# Patient Record
Sex: Female | Born: 1985 | Race: Asian | Hispanic: No | Smoking: Never smoker
Health system: Southern US, Community
[De-identification: ages and names within clinical notes are randomized; demographics above are authoritative.]

## PROBLEM LIST (undated history)

## (undated) DIAGNOSIS — Z789 Other specified health status: Secondary | ICD-10-CM

## (undated) HISTORY — PX: NO PAST SURGERIES: SHX2092

---

## 2009-08-11 ENCOUNTER — Emergency Department (HOSPITAL_COMMUNITY): Admission: EM | Admit: 2009-08-11 | Discharge: 2009-08-11 | Payer: Self-pay | Admitting: Family Medicine

## 2009-08-15 ENCOUNTER — Encounter: Admission: RE | Admit: 2009-08-15 | Discharge: 2009-08-15 | Payer: Self-pay | Admitting: Family Medicine

## 2010-12-30 LAB — POCT PREGNANCY, URINE: Preg Test, Ur: NEGATIVE

## 2012-10-18 ENCOUNTER — Encounter (HOSPITAL_COMMUNITY): Payer: Self-pay

## 2012-10-18 ENCOUNTER — Inpatient Hospital Stay (HOSPITAL_COMMUNITY)
Admission: AD | Admit: 2012-10-18 | Discharge: 2012-10-22 | DRG: 371 | Disposition: A | Discharge status: Home / Self Care | Payer: BC Managed Care – PPO | Source: Ambulatory Visit | Attending: Obstetrics and Gynecology | Admitting: Obstetrics and Gynecology

## 2012-10-18 HISTORY — DX: Other specified health status: Z78.9

## 2012-10-18 LAB — CBC WITH DIFFERENTIAL/PLATELET
Basophils Relative: 0 % (ref 0–1)
Eosinophils Absolute: 0 10*3/uL (ref 0.0–0.7)
Hemoglobin: 14 g/dL (ref 12.0–15.0)
MCH: 27.8 pg (ref 26.0–34.0)
MCHC: 33.5 g/dL (ref 30.0–36.0)
Monocytes Relative: 7 % (ref 3–12)
Neutrophils Relative %: 76 % (ref 43–77)
Platelets: 160 10*3/uL (ref 150–400)
RDW: 14.3 % (ref 11.5–15.5)

## 2012-10-18 LAB — OB RESULTS CONSOLE GC/CHLAMYDIA: Chlamydia: NEGATIVE

## 2012-10-18 LAB — OB RESULTS CONSOLE RUBELLA ANTIBODY, IGM: Rubella: IMMUNE

## 2012-10-18 LAB — LACTATE DEHYDROGENASE: LDH: 282 U/L — ABNORMAL HIGH (ref 94–250)

## 2012-10-18 LAB — TYPE AND SCREEN

## 2012-10-18 LAB — COMPREHENSIVE METABOLIC PANEL
Albumin: 2.7 g/dL — ABNORMAL LOW (ref 3.5–5.2)
Alkaline Phosphatase: 77 U/L (ref 39–117)
BUN: 10 mg/dL (ref 6–23)
Potassium: 4.1 mEq/L (ref 3.5–5.1)
Total Protein: 6.5 g/dL (ref 6.0–8.3)

## 2012-10-18 LAB — AMNISURE RUPTURE OF MEMBRANE (ROM) NOT AT ARMC: Amnisure ROM: POSITIVE

## 2012-10-18 LAB — OB RESULTS CONSOLE RPR: RPR: NONREACTIVE

## 2012-10-18 LAB — URIC ACID: Uric Acid, Serum: 6.4 mg/dL (ref 2.4–7.0)

## 2012-10-18 LAB — OB RESULTS CONSOLE HEPATITIS B SURFACE ANTIGEN: Hepatitis B Surface Ag: NEGATIVE

## 2012-10-18 MED ORDER — TERBUTALINE SULFATE 1 MG/ML IJ SOLN
0.2500 mg | Freq: Once | INTRAMUSCULAR | Status: AC | PRN
  Administered 2012-10-19: 0.25 mg via SUBCUTANEOUS
  Filled 2012-10-18: qty 1

## 2012-10-18 MED ORDER — EPHEDRINE 5 MG/ML INJ: 10.0000 mg | INTRAVENOUS | Status: DC | PRN

## 2012-10-18 MED ORDER — CITRIC ACID-SODIUM CITRATE 334-500 MG/5ML PO SOLN
30.0000 mL | ORAL | Status: DC | PRN
  Administered 2012-10-19: 30 mL via ORAL
  Filled 2012-10-18: qty 15

## 2012-10-18 MED ORDER — OXYTOCIN BOLUS FROM INFUSION: 500.0000 mL | INTRAVENOUS | Status: DC

## 2012-10-18 MED ORDER — LACTATED RINGERS IV SOLN
INTRAVENOUS | Status: DC
  Administered 2012-10-18 – 2012-10-19 (×2): via INTRAVENOUS

## 2012-10-18 MED ORDER — IBUPROFEN 600 MG PO TABS: 600.0000 mg | ORAL_TABLET | Freq: Four times a day (QID) | ORAL | Status: DC | PRN

## 2012-10-18 MED ORDER — ONDANSETRON HCL 4 MG/2ML IJ SOLN: 4.0000 mg | Freq: Four times a day (QID) | INTRAMUSCULAR | Status: DC | PRN

## 2012-10-18 MED ORDER — FENTANYL 2.5 MCG/ML BUPIVACAINE 1/10 % EPIDURAL INFUSION (WH - ANES)
14.0000 mL/h | INTRAMUSCULAR | Status: DC
  Administered 2012-10-19: 14 mL/h via EPIDURAL
  Filled 2012-10-18: qty 125

## 2012-10-18 MED ORDER — ZOLPIDEM TARTRATE 5 MG PO TABS: 5.0000 mg | ORAL_TABLET | Freq: Every evening | ORAL | Status: DC | PRN

## 2012-10-18 MED ORDER — LACTATED RINGERS IV SOLN: 500.0000 mL | INTRAVENOUS | Status: DC | PRN

## 2012-10-18 MED ORDER — EPHEDRINE 5 MG/ML INJ
10.0000 mg | INTRAVENOUS | Status: DC | PRN
  Filled 2012-10-18: qty 4

## 2012-10-18 MED ORDER — DIPHENHYDRAMINE HCL 50 MG/ML IJ SOLN: 12.5000 mg | INTRAMUSCULAR | Status: DC | PRN

## 2012-10-18 MED ORDER — LABETALOL HCL 5 MG/ML IV SOLN
10.0000 mg | INTRAVENOUS | Status: DC | PRN
  Filled 2012-10-18: qty 4

## 2012-10-18 MED ORDER — PHENYLEPHRINE 40 MCG/ML (10ML) SYRINGE FOR IV PUSH (FOR BLOOD PRESSURE SUPPORT)
80.0000 ug | PREFILLED_SYRINGE | INTRAVENOUS | Status: DC | PRN
  Filled 2012-10-18: qty 5

## 2012-10-18 MED ORDER — LIDOCAINE HCL (PF) 1 % IJ SOLN
30.0000 mL | INTRAMUSCULAR | Status: DC | PRN
  Filled 2012-10-18: qty 30

## 2012-10-18 MED ORDER — ACETAMINOPHEN 325 MG PO TABS: 650.0000 mg | ORAL_TABLET | ORAL | Status: DC | PRN

## 2012-10-18 MED ORDER — OXYTOCIN 40 UNITS IN LACTATED RINGERS INFUSION - SIMPLE MED
1.0000 m[IU]/min | INTRAVENOUS | Status: DC
  Filled 2012-10-18: qty 1000

## 2012-10-18 MED ORDER — PHENYLEPHRINE 40 MCG/ML (10ML) SYRINGE FOR IV PUSH (FOR BLOOD PRESSURE SUPPORT): 80.0000 ug | PREFILLED_SYRINGE | INTRAVENOUS | Status: DC | PRN

## 2012-10-18 MED ORDER — OXYCODONE-ACETAMINOPHEN 5-325 MG PO TABS: 1.0000 | ORAL_TABLET | ORAL | Status: DC | PRN

## 2012-10-18 MED ORDER — LACTATED RINGERS IV SOLN
500.0000 mL | Freq: Once | INTRAVENOUS | Status: AC
  Administered 2012-10-19: 500 mL via INTRAVENOUS

## 2012-10-18 MED ORDER — OXYTOCIN 40 UNITS IN LACTATED RINGERS INFUSION - SIMPLE MED: 62.5000 mL/h | INTRAVENOUS | Status: DC

## 2012-10-18 NOTE — MAU Provider Note (Signed)
[name] is a 27 y.o. G1P0 at [redacted]w[redacted]d who presents to MAU today with complaint of ROM.   Neg. pooling; mucus and blood observed in the vagina Neg. Ferning Amnisure - positive  A: ROM  P: RN will contact Dr. Fredia Sorrow for additional orders   Freddi Starr, PA-C 10/18/2012 7:54 PM

## 2012-10-18 NOTE — H&P (Addendum)
[name] is a 27 y.o. female presenting for spontaneous rupture of membranes.  27 yo G1P0 @ 39+5 presents for c/o leaking of fluid that started at 7. The patients pregnancy has been complicated by late prenatal care at 17 weeks.  Otherwise her care has been uneventful.  Her cervix was found to be 4-5/50/-2 and amnisure was positive.  The patient is admitted. History OB History    Grav Para Term Preterm Abortions TAB SAB Ect Mult Living   1              Past Medical History  Diagnosis Date  . No pertinent past medical history    Past Surgical History  Procedure Date  . No past surgeries    Family History: family history is negative for Other. Social History:  reports that she has never smoked. She has never used smokeless tobacco. She reports that she does not drink alcohol or use illicit drugs.   Prenatal Transfer Tool  Maternal Diabetes: No Genetic Screening: Normal Maternal Ultrasounds/Referrals: Normal Fetal Ultrasounds or other Referrals:  None Maternal Substance Abuse:  No Significant Maternal Medications:  None Significant Maternal Lab Results:  None Other Comments:  None  ROS: as above  Dilation: 4.5 Effacement (%): 50 Station: -2 Exam by:: L. Paschal, RN Blood pressure 158/101, pulse 110, temperature 98.8 F (37.1 C), temperature source Oral, resp. rate 18, height 5\' 5"  (1.651 m), weight 82.464 kg (181 lb 12.8 oz), SpO2 98.00%. Exam Physical Exam  Prenatal labs: ABO, Rh:  B positive Antibody:  Negative Rubella:  Immune RPR:   NR HBsAg:   Neg HIV:   NR GBS: Negative (01/02 0000)   Assessment/Plan: 1) Admit 2) Epidural on request 3) The patient was noted to have some elevated BPs in MAU, Pre-eclampsia labs were WNL. Labetalol IV prn BPS 160/100   Brody Kump H. 10/18/2012, 8:51 PM

## 2012-10-18 NOTE — MAU Note (Signed)
Patient states she started leaking clear fluid at 1700. Has changed clothing and pads and continues to leak. States she is having very mild cramping. Reports good fetal movement.

## 2012-10-19 ENCOUNTER — Encounter (HOSPITAL_COMMUNITY): Payer: Self-pay | Admitting: Anesthesiology

## 2012-10-19 ENCOUNTER — Encounter (HOSPITAL_COMMUNITY)
Admission: AD | Disposition: A | Discharge status: Home / Self Care | Payer: Self-pay | Source: Ambulatory Visit | Attending: Obstetrics and Gynecology

## 2012-10-19 ENCOUNTER — Encounter (HOSPITAL_COMMUNITY): Payer: Self-pay | Admitting: *Deleted

## 2012-10-19 ENCOUNTER — Inpatient Hospital Stay (HOSPITAL_COMMUNITY): Payer: BC Managed Care – PPO | Admitting: Anesthesiology

## 2012-10-19 LAB — CBC
HCT: 37.7 % (ref 36.0–46.0)
Hemoglobin: 11.9 g/dL — ABNORMAL LOW (ref 12.0–15.0)
MCHC: 32.6 g/dL (ref 30.0–36.0)
MCV: 83.4 fL (ref 78.0–100.0)
Platelets: 112 10*3/uL — ABNORMAL LOW (ref 150–400)
RBC: 4.39 MIL/uL (ref 3.87–5.11)
RDW: 14.3 % (ref 11.5–15.5)
RDW: 14.4 % (ref 11.5–15.5)

## 2012-10-19 LAB — ABO/RH: ABO/RH(D): B POS

## 2012-10-19 SURGERY — Surgical Case
Anesthesia: Regional | Site: Abdomen | Wound class: Clean Contaminated

## 2012-10-19 MED ORDER — KETOROLAC TROMETHAMINE 30 MG/ML IJ SOLN
INTRAMUSCULAR | Status: AC
  Administered 2012-10-19: 30 mg via INTRAMUSCULAR
  Filled 2012-10-19: qty 1

## 2012-10-19 MED ORDER — MIDAZOLAM HCL 2 MG/2ML IJ SOLN: 0.5000 mg | Freq: Once | INTRAMUSCULAR | Status: DC | PRN

## 2012-10-19 MED ORDER — SENNOSIDES-DOCUSATE SODIUM 8.6-50 MG PO TABS
2.0000 | ORAL_TABLET | Freq: Every day | ORAL | Status: DC
  Administered 2012-10-20 – 2012-10-21 (×2): 2 via ORAL

## 2012-10-19 MED ORDER — ONDANSETRON HCL 4 MG/2ML IJ SOLN
4.0000 mg | INTRAMUSCULAR | Status: DC | PRN
  Administered 2012-10-19: 4 mg via INTRAVENOUS

## 2012-10-19 MED ORDER — EPHEDRINE 5 MG/ML INJ: 10.0000 mg | INTRAVENOUS | Status: DC | PRN

## 2012-10-19 MED ORDER — METOCLOPRAMIDE HCL 5 MG/ML IJ SOLN: 10.0000 mg | Freq: Three times a day (TID) | INTRAMUSCULAR | Status: DC | PRN

## 2012-10-19 MED ORDER — DIPHENHYDRAMINE HCL 50 MG/ML IJ SOLN: 12.5000 mg | INTRAMUSCULAR | Status: DC | PRN

## 2012-10-19 MED ORDER — FENTANYL CITRATE 0.05 MG/ML IJ SOLN
INTRAMUSCULAR | Status: AC
  Administered 2012-10-19: 50 ug via INTRAVENOUS
  Filled 2012-10-19: qty 2

## 2012-10-19 MED ORDER — PROMETHAZINE HCL 25 MG/ML IJ SOLN: 6.2500 mg | INTRAMUSCULAR | Status: DC | PRN

## 2012-10-19 MED ORDER — LACTATED RINGERS IV SOLN
INTRAVENOUS | Status: DC
  Administered 2012-10-19: 23:00:00 via INTRAVENOUS

## 2012-10-19 MED ORDER — DEXTROSE 5 % IV SOLN
1.0000 ug/kg/h | INTRAVENOUS | Status: DC | PRN
  Filled 2012-10-19: qty 2

## 2012-10-19 MED ORDER — LIDOCAINE HCL (PF) 1 % IJ SOLN
INTRAMUSCULAR | Status: DC | PRN
  Administered 2012-10-19 (×2): 5 mL

## 2012-10-19 MED ORDER — TETANUS-DIPHTH-ACELL PERTUSSIS 5-2.5-18.5 LF-MCG/0.5 IM SUSP: 0.5000 mL | Freq: Once | INTRAMUSCULAR | Status: DC

## 2012-10-19 MED ORDER — MORPHINE SULFATE 0.5 MG/ML IJ SOLN
INTRAMUSCULAR | Status: AC
  Filled 2012-10-19: qty 10

## 2012-10-19 MED ORDER — NALOXONE HCL 0.4 MG/ML IJ SOLN: 0.4000 mg | INTRAMUSCULAR | Status: DC | PRN

## 2012-10-19 MED ORDER — MEPERIDINE HCL 25 MG/ML IJ SOLN
INTRAMUSCULAR | Status: AC
  Filled 2012-10-19: qty 1

## 2012-10-19 MED ORDER — SODIUM BICARBONATE 8.4 % IV SOLN
INTRAVENOUS | Status: DC | PRN
  Administered 2012-10-19: 5 mL via EPIDURAL

## 2012-10-19 MED ORDER — SCOPOLAMINE 1 MG/3DAYS TD PT72
MEDICATED_PATCH | TRANSDERMAL | Status: AC
  Administered 2012-10-19: 1.5 mg via TRANSDERMAL
  Filled 2012-10-19: qty 1

## 2012-10-19 MED ORDER — OXYCODONE-ACETAMINOPHEN 5-325 MG PO TABS
1.0000 | ORAL_TABLET | ORAL | Status: DC | PRN
  Administered 2012-10-20 – 2012-10-22 (×8): 1 via ORAL
  Filled 2012-10-19 (×8): qty 1

## 2012-10-19 MED ORDER — NALBUPHINE HCL 10 MG/ML IJ SOLN
5.0000 mg | INTRAMUSCULAR | Status: DC | PRN
  Filled 2012-10-19: qty 1

## 2012-10-19 MED ORDER — ONDANSETRON HCL 4 MG/2ML IJ SOLN
4.0000 mg | Freq: Three times a day (TID) | INTRAMUSCULAR | Status: DC | PRN
  Filled 2012-10-19: qty 2

## 2012-10-19 MED ORDER — DIBUCAINE 1 % RE OINT: 1.0000 "application " | TOPICAL_OINTMENT | RECTAL | Status: DC | PRN

## 2012-10-19 MED ORDER — DIPHENHYDRAMINE HCL 50 MG/ML IJ SOLN: 25.0000 mg | INTRAMUSCULAR | Status: DC | PRN

## 2012-10-19 MED ORDER — FENTANYL CITRATE 0.05 MG/ML IJ SOLN
25.0000 ug | INTRAMUSCULAR | Status: DC | PRN
  Administered 2012-10-19 (×2): 50 ug via INTRAVENOUS

## 2012-10-19 MED ORDER — IBUPROFEN 600 MG PO TABS
600.0000 mg | ORAL_TABLET | Freq: Four times a day (QID) | ORAL | Status: DC
  Administered 2012-10-20 – 2012-10-22 (×10): 600 mg via ORAL
  Filled 2012-10-19 (×10): qty 1

## 2012-10-19 MED ORDER — ONDANSETRON HCL 4 MG PO TABS: 4.0000 mg | ORAL_TABLET | ORAL | Status: DC | PRN

## 2012-10-19 MED ORDER — DIPHENHYDRAMINE HCL 25 MG PO CAPS: 25.0000 mg | ORAL_CAPSULE | ORAL | Status: DC | PRN

## 2012-10-19 MED ORDER — LACTATED RINGERS IV SOLN: 500.0000 mL | Freq: Once | INTRAVENOUS | Status: DC

## 2012-10-19 MED ORDER — OXYTOCIN 10 UNIT/ML IJ SOLN
40.0000 [IU] | INTRAVENOUS | Status: DC | PRN
  Administered 2012-10-19: 40 [IU] via INTRAVENOUS

## 2012-10-19 MED ORDER — MEPERIDINE HCL 25 MG/ML IJ SOLN: 6.2500 mg | INTRAMUSCULAR | Status: DC | PRN

## 2012-10-19 MED ORDER — KETOROLAC TROMETHAMINE 30 MG/ML IJ SOLN
30.0000 mg | Freq: Four times a day (QID) | INTRAMUSCULAR | Status: AC | PRN
  Administered 2012-10-19: 30 mg via INTRAMUSCULAR

## 2012-10-19 MED ORDER — PRENATAL MULTIVITAMIN CH: 1.0000 | ORAL_TABLET | Freq: Every day | ORAL | Status: DC

## 2012-10-19 MED ORDER — PHENYLEPHRINE 40 MCG/ML (10ML) SYRINGE FOR IV PUSH (FOR BLOOD PRESSURE SUPPORT): 80.0000 ug | PREFILLED_SYRINGE | INTRAVENOUS | Status: DC | PRN

## 2012-10-19 MED ORDER — ACETAMINOPHEN 10 MG/ML IV SOLN
1000.0000 mg | Freq: Four times a day (QID) | INTRAVENOUS | Status: AC | PRN
  Filled 2012-10-19: qty 100

## 2012-10-19 MED ORDER — KETOROLAC TROMETHAMINE 30 MG/ML IJ SOLN
30.0000 mg | Freq: Four times a day (QID) | INTRAMUSCULAR | Status: AC | PRN
  Administered 2012-10-19: 30 mg via INTRAVENOUS
  Filled 2012-10-19: qty 1

## 2012-10-19 MED ORDER — MORPHINE SULFATE (PF) 0.5 MG/ML IJ SOLN
INTRAMUSCULAR | Status: DC | PRN
  Administered 2012-10-19: 4 mg via EPIDURAL

## 2012-10-19 MED ORDER — ONDANSETRON HCL 4 MG/2ML IJ SOLN
INTRAMUSCULAR | Status: DC | PRN
  Administered 2012-10-19: 4 mg via INTRAVENOUS

## 2012-10-19 MED ORDER — OXYTOCIN 10 UNIT/ML IJ SOLN
INTRAMUSCULAR | Status: AC
  Filled 2012-10-19: qty 4

## 2012-10-19 MED ORDER — MEPERIDINE HCL 25 MG/ML IJ SOLN
6.2500 mg | INTRAMUSCULAR | Status: DC | PRN
  Administered 2012-10-19: 6.25 mg via INTRAVENOUS

## 2012-10-19 MED ORDER — OXYTOCIN 40 UNITS IN LACTATED RINGERS INFUSION - SIMPLE MED: 62.5000 mL/h | INTRAVENOUS | Status: AC

## 2012-10-19 MED ORDER — SCOPOLAMINE 1 MG/3DAYS TD PT72
1.0000 | MEDICATED_PATCH | Freq: Once | TRANSDERMAL | Status: AC
  Administered 2012-10-19: 1.5 mg via TRANSDERMAL

## 2012-10-19 MED ORDER — LACTATED RINGERS IV SOLN
INTRAVENOUS | Status: DC | PRN
  Administered 2012-10-19 (×2): via INTRAVENOUS

## 2012-10-19 MED ORDER — WITCH HAZEL-GLYCERIN EX PADS: 1.0000 "application " | MEDICATED_PAD | CUTANEOUS | Status: DC | PRN

## 2012-10-19 MED ORDER — FENTANYL 2.5 MCG/ML BUPIVACAINE 1/10 % EPIDURAL INFUSION (WH - ANES): 14.0000 mL/h | INTRAMUSCULAR | Status: DC

## 2012-10-19 MED ORDER — MEASLES, MUMPS & RUBELLA VAC ~~LOC~~ INJ
0.5000 mL | INJECTION | Freq: Once | SUBCUTANEOUS | Status: DC
  Filled 2012-10-19: qty 0.5

## 2012-10-19 MED ORDER — SODIUM CHLORIDE 0.9 % IJ SOLN: 3.0000 mL | INTRAMUSCULAR | Status: DC | PRN

## 2012-10-19 MED ORDER — PRENATAL MULTIVITAMIN CH
1.0000 | ORAL_TABLET | Freq: Every day | ORAL | Status: DC
  Administered 2012-10-20 – 2012-10-22 (×3): 1 via ORAL
  Filled 2012-10-19 (×3): qty 1

## 2012-10-19 MED ORDER — ONDANSETRON HCL 4 MG/2ML IJ SOLN
INTRAMUSCULAR | Status: AC
  Filled 2012-10-19: qty 2

## 2012-10-19 MED ORDER — ZOLPIDEM TARTRATE 5 MG PO TABS: 5.0000 mg | ORAL_TABLET | Freq: Every evening | ORAL | Status: DC | PRN

## 2012-10-19 SURGICAL SUPPLY — 29 items
CLOTH BEACON ORANGE TIMEOUT ST (SAFETY) ×2 IMPLANT
CONTAINER PREFILL 10% NBF 15ML (MISCELLANEOUS) IMPLANT
DRAPE LG THREE QUARTER DISP (DRAPES) ×2 IMPLANT
DRSG OPSITE POSTOP 4X10 (GAUZE/BANDAGES/DRESSINGS) ×2 IMPLANT
DURAPREP 26ML APPLICATOR (WOUND CARE) ×2 IMPLANT
ELECT REM PT RETURN 9FT ADLT (ELECTROSURGICAL) ×2
ELECTRODE REM PT RTRN 9FT ADLT (ELECTROSURGICAL) ×1 IMPLANT
EXTRACTOR VACUUM M CUP 4 TUBE (SUCTIONS) IMPLANT
GLOVE ECLIPSE 7.0 STRL STRAW (GLOVE) ×4 IMPLANT
GOWN PREVENTION PLUS LG XLONG (DISPOSABLE) ×2 IMPLANT
GOWN PREVENTION PLUS XLARGE (GOWN DISPOSABLE) ×2 IMPLANT
KIT ABG SYR 3ML LUER SLIP (SYRINGE) IMPLANT
NEEDLE HYPO 25X5/8 SAFETYGLIDE (NEEDLE) IMPLANT
NS IRRIG 1000ML POUR BTL (IV SOLUTION) ×2 IMPLANT
PACK C SECTION WH (CUSTOM PROCEDURE TRAY) ×2 IMPLANT
PAD OB MATERNITY 4.3X12.25 (PERSONAL CARE ITEMS) ×2 IMPLANT
RETRACTOR WND ALEXIS 25 LRG (MISCELLANEOUS) IMPLANT
RETRACTOR WOUND ALXS 34CM XLRG (MISCELLANEOUS) IMPLANT
RTRCTR WOUND ALEXIS 25CM LRG (MISCELLANEOUS)
RTRCTR WOUND ALEXIS 34CM XLRG (MISCELLANEOUS)
SLEEVE SCD COMPRESS KNEE MED (MISCELLANEOUS) IMPLANT
STAPLER VISISTAT 35W (STAPLE) IMPLANT
SUT MNCRL 0 VIOLET CTX 36 (SUTURE) ×3 IMPLANT
SUT MON AB 2-0 CT1 27 (SUTURE) ×4 IMPLANT
SUT MONOCRYL 0 CTX 36 (SUTURE) ×3
SUT PLAIN 0 NONE (SUTURE) IMPLANT
TOWEL OR 17X24 6PK STRL BLUE (TOWEL DISPOSABLE) ×6 IMPLANT
TRAY FOLEY CATH 14FR (SET/KITS/TRAYS/PACK) IMPLANT
WATER STERILE IRR 1000ML POUR (IV SOLUTION) ×2 IMPLANT

## 2012-10-19 NOTE — Progress Notes (Signed)
Post-op CBC results-platelet count 124.  Ok to give Toradol and d/c epidural per Dr Brayton Caves.

## 2012-10-19 NOTE — Progress Notes (Signed)
Pt states she is feeling weak.  Requested for husband to hold infant.  Husband has street shirt under bunny suit.  Infant wrapped and given to husband

## 2012-10-19 NOTE — Transfer of Care (Signed)
Immediate Anesthesia Transfer of Care Note  Patient: [name]  Procedure(s) Performed: Procedure(s) (LRB) with comments: CESAREAN SECTION (N/A)  Patient Location: PACU  Anesthesia Type:Epidural  Level of Consciousness: awake  Airway & Oxygen Therapy: Patient Spontanous Breathing  Post-op Assessment: Report given to PACU RN and Post -op Vital signs reviewed and stable  Post vital signs: Reviewed and stable  Complications: No apparent anesthesia complications

## 2012-10-19 NOTE — Anesthesia Postprocedure Evaluation (Signed)
Anesthesia Post Note  Patient: [name]  Procedure(s) Performed: Procedure(s) (LRB): CESAREAN SECTION (N/A)  Anesthesia type: Epidural  Patient location: PACU  Post pain: Pain level controlled  Post assessment: Post-op Vital signs reviewed  Last Vitals:  Filed Vitals:   10/19/12 1330  BP: 86/47  Pulse: 102  Temp: 36.9 C  Resp:     Post vital signs: Reviewed  Level of consciousness: awake  Complications: No apparent anesthesia complications

## 2012-10-19 NOTE — Progress Notes (Signed)
To OR per bed.

## 2012-10-19 NOTE — Anesthesia Procedure Notes (Signed)
Epidural Patient location during procedure: OB Start time: 10/19/2012 11:08 AM  Staffing Anesthesiologist: Angus Seller., Harrell Gave. Performed by: anesthesiologist   Preanesthetic Checklist Completed: patient identified, site marked, surgical consent, pre-op evaluation, timeout performed, IV checked, risks and benefits discussed and monitors and equipment checked  Epidural Patient position: sitting Prep: site prepped and draped and DuraPrep Patient monitoring: continuous pulse ox and blood pressure Approach: midline Injection technique: LOR air and LOR saline  Needle:  Needle type: Tuohy  Needle gauge: 17 G Needle length: 9 cm and 9 Needle insertion depth: 5 cm cm Catheter type: closed end flexible Catheter size: 19 Gauge Catheter at skin depth: 10 cm Test dose: negative  Assessment Events: blood not aspirated, injection not painful, no injection resistance, negative IV test and no paresthesia  Additional Notes Patient identified.  Risk benefits discussed including failed block, incomplete pain control, headache, nerve damage, paralysis, blood pressure changes, nausea, vomiting, reactions to medication both toxic or allergic, and postpartum back pain.  Patient expressed understanding and wished to proceed.  All questions were answered.  Sterile technique used throughout procedure and epidural site dressed with sterile barrier dressing. No paresthesia or other complications noted.The patient did not experience any signs of intravascular injection such as tinnitus or metallic taste in mouth nor signs of intrathecal spread such as rapid motor block. Please see nursing notes for vital signs.

## 2012-10-19 NOTE — Progress Notes (Signed)
Pt comfortable.  Filed Vitals:   10/19/12 0701 10/19/12 0731 10/19/12 0802 10/19/12 0831  BP: 155/101 150/99 147/91 148/89  Pulse: 107 96 95 92  Temp:   98.1 F (36.7 C)   TempSrc:   Oral   Resp: 20 16 20 16   Height:      Weight:      SpO2:       FHTs 130s gstv, NST R; class 1.  Toco q2  SVE  6/80/-2, forebag clear.   Continue pit.  BPs stable.

## 2012-10-19 NOTE — Anesthesia Preprocedure Evaluation (Signed)
Anesthesia Evaluation  Patient identified by MRN, date of birth, ID band Patient awake    Reviewed: Allergy & Precautions, H&P , Patient's Chart, lab work & pertinent test results  Airway Mallampati: II TM Distance: >3 FB Neck ROM: full    Dental No notable dental hx.    Pulmonary neg pulmonary ROS,  breath sounds clear to auscultation  Pulmonary exam normal       Cardiovascular hypertension, negative cardio ROS  Rhythm:regular Rate:Normal     Neuro/Psych negative neurological ROS  negative psych ROS   GI/Hepatic negative GI ROS, Neg liver ROS,   Endo/Other  negative endocrine ROS  Renal/GU negative Renal ROS     Musculoskeletal   Abdominal   Peds  Hematology negative hematology ROS (+)   Anesthesia Other Findings   Reproductive/Obstetrics (+) Pregnancy                           Anesthesia Physical Anesthesia Plan  ASA: III  Anesthesia Plan: Epidural   Post-op Pain Management:    Induction:   Airway Management Planned:   Additional Equipment:   Intra-op Plan:   Post-operative Plan:   Informed Consent: I have reviewed the patients History and Physical, chart, labs and discussed the procedure including the risks, benefits and alternatives for the proposed anesthesia with the patient or authorized representative who has indicated his/her understanding and acceptance.     Plan Discussed with:   Anesthesia Plan Comments:         Anesthesia Quick Evaluation  

## 2012-10-19 NOTE — Progress Notes (Signed)
Abd shaved, Nasal betadine done, CHG to lower abd after shave

## 2012-10-19 NOTE — Progress Notes (Signed)
Several RN's in room assisting with interventions

## 2012-10-19 NOTE — Progress Notes (Signed)
IV bolus started.  Pitocin stopped.  Oxygen 10/L per mask applied.  HOB lowered.  IFSE applied..  Anesthesia notified per Salome Holmes RN.

## 2012-10-19 NOTE — Progress Notes (Signed)
Waiting on plt count

## 2012-10-19 NOTE — Progress Notes (Signed)
In O.R. 

## 2012-10-20 ENCOUNTER — Encounter (HOSPITAL_COMMUNITY): Payer: Self-pay | Admitting: Obstetrics and Gynecology

## 2012-10-20 LAB — CBC
Hemoglobin: 11.4 g/dL — ABNORMAL LOW (ref 12.0–15.0)
MCH: 27.3 pg (ref 26.0–34.0)
MCHC: 32.6 g/dL (ref 30.0–36.0)
Platelets: 101 10*3/uL — ABNORMAL LOW (ref 150–400)

## 2012-10-20 NOTE — Addendum Note (Signed)
Addendum  created 10/20/12 0806 by Suella Grove, CRNA   Modules edited:Notes Section

## 2012-10-20 NOTE — Anesthesia Postprocedure Evaluation (Signed)
  Anesthesia Post-op Note  Patient: [name]  Procedure(s) Performed: Procedure(s) (LRB) with comments: CESAREAN SECTION (N/A)  Patient Location: Mother/Baby  Anesthesia Type:Epidural  Level of Consciousness: awake  Airway and Oxygen Therapy: Patient Spontanous Breathing  Post-op Pain: none  Post-op Assessment: Patient's Cardiovascular Status Stable, Respiratory Function Stable, Patent Airway, No signs of Nausea or vomiting, Adequate PO intake, Pain level controlled, No headache, No backache, No residual numbness and No residual motor weakness  Post-op Vital Signs: Reviewed and stable  Complications: No apparent anesthesia complications

## 2012-10-20 NOTE — Progress Notes (Signed)
Post Op Day 1 Subjective: no complaints  Objective: Blood pressure 137/85, pulse 109, temperature 98.4 F (36.9 C), temperature source Axillary, resp. rate 20, height 5\' 5"  (1.651 m), weight 181 lb 12.8 oz (82.464 kg), SpO2 96.00%, unknown if currently breastfeeding.  Physical Exam:  General: alert Lochia: appropriate Uterine Fundus: firm Incision: healing well   Basename 10/20/12 0525 10/19/12 1417  HGB 11.4* 11.9*  HCT 35.0* 36.8    Assessment/Plan: Plan for discharge tomorrow   LOS: 2 days   Steffen Hase D 10/20/2012, 10:52 AM

## 2012-10-20 NOTE — Discharge Summary (Signed)
Obstetric Discharge Summary Reason for Admission: onset of labor Prenatal Procedures: ultrasound Intrapartum Procedures: cesarean: low cervical, transverse Postpartum Procedures: none Complications-Operative and Postpartum: none Hemoglobin  Date Value Range Status  10/20/2012 11.4* 12.0 - 15.0 g/dL Final     HCT  Date Value Range Status  10/20/2012 35.0* 36.0 - 46.0 % Final    Physical Exam:  General: alert Lochia: appropriate Uterine Fundus: firm Incision: healing well DVT Evaluation: No evidence of DVT seen on physical exam.  Discharge Diagnoses: Term Pregnancy-delivered by emergency C/S for Category 3 fetal heart rate tracing.  Discharge Information: Date: 10/20/2012 Activity: Limited Diet: routine Medications: PNV, Ibuprofen and Percocet Condition: stable Instructions: refer to practice specific booklet Discharge to: home   Newborn Data: Live born female  Birth Weight: 7 lb 9.7 oz (3450 g) APGAR: 7, 9  Home with mother.  [name] D 10/20/2012, 8:50 PM

## 2012-10-20 NOTE — Op Note (Signed)
NAMERILYA, LONGO NO.:  [id number]  MEDICAL RECORD NO.:  [id number]  LOCATION:  9111                          FACILITY:  [facility]  PHYSICIAN:  Malva Limes, M.D.    DATE OF BIRTH:  04/20/1986  DATE OF PROCEDURE: DATE OF DISCHARGE:                              OPERATIVE REPORT   PREOPERATIVE DIAGNOSIS: 1. Intrauterine pregnancy at term. 2. Fetal distress.  POSTOPERATIVE DIAGNOSIS: 1. Intrauterine pregnancy at term. 2. Fetal distress.  PROCEDURE:  Emergent primary low-transverse cesarean section.  SURGEON:  Malva Limes, M.D.  ANESTHESIA:  Epidural.  ANTIBIOTICS:  Ancef 2 g.  DRAINS:  Foley bedside drainage.  ESTIMATED BLOOD LOSS:  900 mL.  COMPLICATIONS:  None.  SPECIMENS:  Placenta sent to pathology.  Arterial cord gases sent to respiratory therapy.  INDICATIONS:  The patient was in labor when the baby had spontaneous deceleration to the 50s.  I was contacted after the heart rate was down for approximately 8 minutes.  On entering the room,I performed a vaginal exam and the head was -2 station.  Fetal scalp stimulation did not change the heart rate.  The patient was given terbutaline 0.25 subcu and taken to the operative suite for an emergent cesarean section.  PROCEDURE IN DETAIL:  In the operating room the fetal heart tones had improved to the 90s. Once the Foley catheter was placed, the patient was prepped and draped in usual fashion for this procedure. A Pfannenstiel incision was made above the pubic symphysis.  On entering the abdominal cavity, the bladder flap was taken down with sharp dissection.  A low-transverse uterine incision was made in the midline with the Metzenbaum scissors and extended laterally with blunt dissection.  Amniotic fluid was noted be clear.  The infant was delivered in a vertex presentation.  On delivery of the head, the oropharynx and nostrils were bulb suctioned and then the remaining infant was delivered.  The  baby cried spontaneously. There was no evidence of any cord issues.  I should note that the infant was delivered with vacuum extractor.  After delivery of the infant, the cord was doubly clamped, cut, and the infant handed to the awaiting NICU team.  Arterial cord blood was then obtained.  It was reported to be 6.94.  The cord blood was then obtained.  The placenta was then manually removed.  The uterus was exteriorized.  Uterine cavity was cleaned with a wet lap.  The uterus was examined.  Fallopian tubes and ovaries appeared to be normal.  The uterine incision was closed in a single layer of 0 Monocryl suture in a running locking fashion.  The bladder flap was closed using 2-0 Vicryl in a running fashion.  Uterus was placed back in abdominal cavity. Hemostasis was checked and felt to be adequate.  The parietal peritoneum and rectus muscles were reapproximated in midline using 2-0 Monocryl in a running fashion.  The fascia was closed using 0 Monocryl suture in a running fashion.  Subcuticular tissue was made hemostatic with the Bovie.  Stainless steel clips used to close the skin.  The patient tolerated the procedure well.  She was taken to recovery  room in stable condition.  Instrument and lap counts were correct x3.  The baby's Apgars were 7 at 1 minute, 9 at 5 minutes.  Baby was taken to the newborn nursery.          ______________________________ Malva Limes, M.D.     MA/MEDQ  D:  10/19/2012  T:  10/20/2012  Job:  [id number]

## 2012-10-21 MED ORDER — OXYCODONE-ACETAMINOPHEN 5-325 MG PO TABS: 1.0000 | ORAL_TABLET | ORAL | Status: DC | PRN

## 2012-10-21 NOTE — Progress Notes (Signed)
Early discharged cancelled due to baby not ready for discharge.

## 2013-11-08 ENCOUNTER — Ambulatory Visit: Payer: BC Managed Care – PPO | Attending: Internal Medicine

## 2014-07-29 ENCOUNTER — Encounter (HOSPITAL_COMMUNITY): Payer: Self-pay | Admitting: Obstetrics and Gynecology

## 2015-06-03 ENCOUNTER — Other Ambulatory Visit: Payer: Self-pay | Admitting: Obstetrics and Gynecology

## 2015-06-03 LAB — OB RESULTS CONSOLE GC/CHLAMYDIA
CHLAMYDIA, DNA PROBE: NEGATIVE
Gonorrhea: NEGATIVE

## 2015-06-03 LAB — OB RESULTS CONSOLE HEPATITIS B SURFACE ANTIGEN: HEP B S AG: NEGATIVE

## 2015-06-03 LAB — OB RESULTS CONSOLE ABO/RH: RH Type: POSITIVE

## 2015-06-03 LAB — OB RESULTS CONSOLE RUBELLA ANTIBODY, IGM: RUBELLA: IMMUNE

## 2015-06-03 LAB — OB RESULTS CONSOLE HIV ANTIBODY (ROUTINE TESTING): HIV: NONREACTIVE

## 2015-06-03 LAB — OB RESULTS CONSOLE ANTIBODY SCREEN: Antibody Screen: NEGATIVE

## 2015-06-03 LAB — OB RESULTS CONSOLE RPR: RPR: NONREACTIVE

## 2015-06-04 LAB — CYTOLOGY - PAP

## 2015-11-11 ENCOUNTER — Encounter (HOSPITAL_COMMUNITY): Payer: Self-pay | Admitting: Emergency Medicine

## 2015-11-11 ENCOUNTER — Emergency Department (INDEPENDENT_AMBULATORY_CARE_PROVIDER_SITE_OTHER)
Admission: EM | Admit: 2015-11-11 | Discharge: 2015-11-11 | Disposition: A | Discharge status: Home / Self Care | Payer: Medicaid Other | Source: Home / Self Care | Attending: Family Medicine | Admitting: Family Medicine

## 2015-11-11 DIAGNOSIS — H6121 Impacted cerumen, right ear: Secondary | ICD-10-CM

## 2015-11-11 NOTE — ED Notes (Signed)
Pt reports end of q-tip lodged in her right ear onset 1600 today A&O x4... No acute distress.

## 2015-11-11 NOTE — Discharge Instructions (Signed)
Cerumen Impaction The structures of the external ear canal secrete a waxy substance known as cerumen. Excess cerumen can build up in the ear canal, causing a condition known as cerumen impaction. Cerumen impaction can cause ear pain and disrupt the function of the ear. The rate of cerumen production differs for each individual. In certain individuals, the configuration of the ear canal may decrease his or her ability to naturally remove cerumen. CAUSES Cerumen impaction is caused by excessive cerumen production or buildup. RISK FACTORS  Frequent use of swabs to clean ears.  Having narrow ear canals.  Having eczema.  Being dehydrated. SIGNS AND SYMPTOMS  Diminished hearing.  Ear drainage.  Ear pain.  Ear itch. TREATMENT Treatment may involve:  Over-the-counter or prescription ear drops to soften the cerumen.  Removal of cerumen by a health care provider. This may be done with:  Irrigation with warm water. This is the most common method of removal.  Ear curettes and other instruments.  Surgery. This may be done in severe cases. HOME CARE INSTRUCTIONS  Take medicines only as directed by your health care provider.  Do not insert objects into the ear with the intent of cleaning the ear. PREVENTION  Do not insert objects into the ear, even with the intent of cleaning the ear. Removing cerumen as a part of normal hygiene is not necessary, and the use of swabs in the ear canal is not recommended.  Drink enough water to keep your urine clear or pale yellow.  Control your eczema if you have it. SEEK MEDICAL CARE IF:  You develop ear pain.  You develop bleeding from the ear.  The cerumen does not clear after you use ear drops as directed.   This information is not intended to replace advice given to you by your health care provider. Make sure you discuss any questions you have with your health care provider.   Document Released: 10/21/2004 Document Revised: 10/04/2014  Document Reviewed: 04/30/2015 Elsevier Interactive Patient Education 2016 [employer]  

## 2015-11-12 NOTE — ED Provider Notes (Signed)
CSN: [id number]     Arrival date & time 11/11/15  1644 History   First MD Initiated Contact with Patient 11/11/15 1741     Chief Complaint  Patient presents with  . Foreign Body in Ear   (Consider location/radiation/quality/duration/timing/severity/associated sxs/prior Treatment) HPI Patient presents with chief complaint of Q-tip in her right ear. She states that she was cleaning her ear earlier in the hand of the Q-tip came off in a year. She states her husband has seen a foreign body also. She has no other symptoms at this time. Past Medical History  Diagnosis Date  . No pertinent past medical history    Past Surgical History  Procedure Laterality Date  . No past surgeries    . Cesarean section  10/19/2012    Procedure: CESAREAN SECTION;  Surgeon: Levi Aland, MD;  Location: [facility];  Service: Obstetrics;  Laterality: N/A;   Family History  Problem Relation Age of Onset  . Other Neg Hx    Social History  Substance Use Topics  . Smoking status: Never Smoker   . Smokeless tobacco: Never Used  . Alcohol Use: No   OB History    Gravida Para Term Preterm AB TAB SAB Ectopic Multiple Living   Review of Systems Foreign body sensation right ear Allergies  Review of patient's allergies indicates no known allergies.  Home Medications   Prior to Admission medications   Medication Sig Start Date End Date Taking? Authorizing Provider  calcium carbonate (TUMS - DOSED IN MG ELEMENTAL CALCIUM) 500 MG chewable tablet Chew 1 tablet by mouth daily.    Historical Provider, MD  oxyCODONE-acetaminophen (PERCOCET/ROXICET) 5-325 MG per tablet Take 1-2 tablets by mouth every 4 (four) hours as needed (moderate - severe pain). 10/21/12   Ilda Mori, MD  Prenatal Vit-Fe Fumarate-FA (PRENATAL MULTIVITAMIN) TABS Take 1 tablet by mouth daily.    Historical Provider, MD   Meds Ordered and Administered this Visit  Medications - No data to display  BP 137/86 mmHg  Pulse 103   Temp(Src) 98.6 F (37 C) (Oral)  Resp 16  SpO2 98% No data found.   Physical Exam  Constitutional: She is oriented to person, place, and time. She appears well-developed and well-nourished.  HENT:  Head: Normocephalic and atraumatic.  Right Ear: External ear normal.  Left Ear: External ear normal.  Visualization of the right here is only significant for cerumen there is no foreign body identified.  Pulmonary/Chest: Effort normal.  Neurological: She is alert and oriented to person, place, and time.  Skin: Skin is warm and dry.  Psychiatric: She has a normal mood and affect. Her behavior is normal.  Nursing note and vitals reviewed.   ED Course  Procedures (including critical care time)  Labs Review Labs Reviewed - No data to display  Imaging Review No results found.   Visual Acuity Review  Right Eye Distance:   Left Eye Distance:   Bilateral Distance:    Right Eye Near:   Left Eye Near:    Bilateral Near:        Right ears flushed with only wax being removed. No foreign body. MDM   1. Excess ear wax, right    Patient is advised to continue home symptomatic treatment.  Patient is advised that if there are new or worsening symptoms or attend the emergency department, or contact primary care provider. Instructions of care provided discharged  home in stable condition. Return to work/school note provided.  THIS NOTE WAS GENERATED USING A VOICE RECOGNITION SOFTWARE PROGRAM. ALL REASONABLE EFFORTS  WERE MADE TO PROOFREAD THIS DOCUMENT FOR ACCURACY.     Tharon Aquas, PA 11/12/15 1911

## 2015-11-14 ENCOUNTER — Other Ambulatory Visit: Payer: Self-pay | Admitting: Obstetrics

## 2015-11-14 LAB — OB RESULTS CONSOLE GBS: GBS: NEGATIVE

## 2015-12-05 ENCOUNTER — Inpatient Hospital Stay (HOSPITAL_COMMUNITY): Payer: Medicaid Other | Admitting: Anesthesiology

## 2015-12-05 ENCOUNTER — Encounter (HOSPITAL_COMMUNITY): Payer: Self-pay | Admitting: *Deleted

## 2015-12-05 ENCOUNTER — Encounter (HOSPITAL_COMMUNITY)
Admission: AD | Disposition: A | Discharge status: Home / Self Care | Payer: Self-pay | Source: Ambulatory Visit | Attending: Obstetrics

## 2015-12-05 ENCOUNTER — Inpatient Hospital Stay (HOSPITAL_COMMUNITY)
Admission: AD | Admit: 2015-12-05 | Discharge: 2015-12-07 | DRG: 766 | Disposition: A | Discharge status: Home / Self Care | Payer: Medicaid Other | Source: Ambulatory Visit | Attending: Obstetrics | Admitting: Obstetrics

## 2015-12-05 DIAGNOSIS — O34211 Maternal care for low transverse scar from previous cesarean delivery: Principal | ICD-10-CM | POA: Diagnosis present

## 2015-12-05 DIAGNOSIS — Z23 Encounter for immunization: Secondary | ICD-10-CM

## 2015-12-05 DIAGNOSIS — O34219 Maternal care for unspecified type scar from previous cesarean delivery: Secondary | ICD-10-CM | POA: Diagnosis present

## 2015-12-05 DIAGNOSIS — IMO0001 Reserved for inherently not codable concepts without codable children: Secondary | ICD-10-CM

## 2015-12-05 DIAGNOSIS — Z3A38 38 weeks gestation of pregnancy: Secondary | ICD-10-CM | POA: Diagnosis not present

## 2015-12-05 LAB — COMPREHENSIVE METABOLIC PANEL
ALBUMIN: 2.9 g/dL — AB (ref 3.5–5.0)
ALK PHOS: 107 U/L (ref 38–126)
ALT: 39 U/L (ref 14–54)
AST: 29 U/L (ref 15–41)
Anion gap: 6 (ref 5–15)
BILIRUBIN TOTAL: 0.5 mg/dL (ref 0.3–1.2)
BUN: 8 mg/dL (ref 6–20)
CO2: 22 mmol/L (ref 22–32)
Calcium: 8.6 mg/dL — ABNORMAL LOW (ref 8.9–10.3)
Chloride: 107 mmol/L (ref 101–111)
Creatinine, Ser: 0.57 mg/dL (ref 0.44–1.00)
GFR calc Af Amer: >60 mL/min (ref >60)
GFR calc non Af Amer: >60 mL/min (ref >60)
GLUCOSE: 90 mg/dL (ref 65–99)
POTASSIUM: 3.9 mmol/L (ref 3.5–5.1)
Sodium: 135 mmol/L (ref 135–145)
TOTAL PROTEIN: 5.9 g/dL — AB (ref 6.5–8.1)

## 2015-12-05 LAB — CBC
HCT: 40.2 % (ref 36.0–46.0)
HCT: 37.6 % (ref 36.0–46.0)
Hemoglobin: 13.3 g/dL (ref 12.0–15.0)
Hemoglobin: 12.3 g/dL (ref 12.0–15.0)
MCH: 25.9 pg — AB (ref 26.0–34.0)
MCH: 25.6 pg — AB (ref 26.0–34.0)
MCHC: 33.1 g/dL (ref 30.0–36.0)
MCHC: 32.7 g/dL (ref 30.0–36.0)
MCV: 78.2 fL (ref 78.0–100.0)
MCV: 78.3 fL (ref 78.0–100.0)
PLATELETS: 135 10*3/uL — AB (ref 150–400)
PLATELETS: 142 10*3/uL — AB (ref 150–400)
RBC: 5.14 MIL/uL — AB (ref 3.87–5.11)
RBC: 4.8 MIL/uL (ref 3.87–5.11)
RDW: 16.5 % — AB (ref 11.5–15.5)
RDW: 16.6 % — AB (ref 11.5–15.5)
WBC: 11.6 10*3/uL — ABNORMAL HIGH (ref 4.0–10.5)
WBC: 13.3 10*3/uL — AB (ref 4.0–10.5)

## 2015-12-05 LAB — TYPE AND SCREEN
ABO/RH(D): B POS
Antibody Screen: NEGATIVE

## 2015-12-05 LAB — RPR: RPR: NONREACTIVE

## 2015-12-05 SURGERY — Surgical Case
Anesthesia: Epidural

## 2015-12-05 MED ORDER — LACTATED RINGERS IV SOLN
500.0000 mL | Freq: Once | INTRAVENOUS | Status: AC
  Administered 2015-12-05: 500 mL via INTRAVENOUS

## 2015-12-05 MED ORDER — LACTATED RINGERS IV SOLN
INTRAVENOUS | Status: DC | PRN
Start: 2015-12-05 — End: 2015-12-05
  Administered 2015-12-05: 15:00:00 via INTRAVENOUS

## 2015-12-05 MED ORDER — DIPHENHYDRAMINE HCL 50 MG/ML IJ SOLN: 12.5000 mg | INTRAMUSCULAR | Status: DC | PRN

## 2015-12-05 MED ORDER — LACTATED RINGERS IV SOLN: 500.0000 mL | INTRAVENOUS | Status: DC | PRN

## 2015-12-05 MED ORDER — SODIUM BICARBONATE 8.4 % IV SOLN
INTRAVENOUS | Status: AC
  Filled 2015-12-05: qty 50

## 2015-12-05 MED ORDER — OXYCODONE-ACETAMINOPHEN 5-325 MG PO TABS: 2.0000 | ORAL_TABLET | ORAL | Status: DC | PRN

## 2015-12-05 MED ORDER — ONDANSETRON HCL 4 MG/2ML IJ SOLN
INTRAMUSCULAR | Status: AC
  Filled 2015-12-05: qty 2

## 2015-12-05 MED ORDER — CLINDAMYCIN PHOSPHATE 900 MG/50ML IV SOLN
900.0000 mg | Freq: Once | INTRAVENOUS | Status: AC
  Administered 2015-12-05: 900 mg via INTRAVENOUS
  Filled 2015-12-05: qty 50

## 2015-12-05 MED ORDER — MEPERIDINE HCL 25 MG/ML IJ SOLN: 6.2500 mg | INTRAMUSCULAR | Status: DC | PRN

## 2015-12-05 MED ORDER — MORPHINE SULFATE (PF) 0.5 MG/ML IJ SOLN
INTRAMUSCULAR | Status: AC
  Filled 2015-12-05: qty 10

## 2015-12-05 MED ORDER — IBUPROFEN 600 MG PO TABS
600.0000 mg | ORAL_TABLET | Freq: Four times a day (QID) | ORAL | Status: DC
  Administered 2015-12-06 – 2015-12-07 (×7): 600 mg via ORAL
  Filled 2015-12-05 (×7): qty 1

## 2015-12-05 MED ORDER — NALOXONE HCL 2 MG/2ML IJ SOSY: 1.0000 ug/kg/h | PREFILLED_SYRINGE | INTRAMUSCULAR | Status: DC | PRN

## 2015-12-05 MED ORDER — NALBUPHINE HCL 10 MG/ML IJ SOLN: 5.0000 mg | Freq: Once | INTRAMUSCULAR | Status: DC | PRN

## 2015-12-05 MED ORDER — DIPHENHYDRAMINE HCL 25 MG PO CAPS: 25.0000 mg | ORAL_CAPSULE | Freq: Four times a day (QID) | ORAL | Status: DC | PRN

## 2015-12-05 MED ORDER — OXYTOCIN 10 UNIT/ML IJ SOLN
2.5000 [IU]/h | INTRAVENOUS | Status: DC
  Filled 2015-12-05: qty 10

## 2015-12-05 MED ORDER — PRENATAL MULTIVITAMIN CH
1.0000 | ORAL_TABLET | Freq: Every day | ORAL | Status: DC
  Administered 2015-12-06: 1 via ORAL
  Filled 2015-12-05 (×2): qty 1

## 2015-12-05 MED ORDER — LIDOCAINE HCL (PF) 1 % IJ SOLN
INTRAMUSCULAR | Status: DC | PRN
  Administered 2015-12-05: 6 mL via EPIDURAL
  Administered 2015-12-05: 4 mL

## 2015-12-05 MED ORDER — FLEET ENEMA 7-19 GM/118ML RE ENEM: 1.0000 | ENEMA | RECTAL | Status: DC | PRN

## 2015-12-05 MED ORDER — SCOPOLAMINE 1 MG/3DAYS TD PT72: 1.0000 | MEDICATED_PATCH | Freq: Once | TRANSDERMAL | Status: DC

## 2015-12-05 MED ORDER — PHENYLEPHRINE 40 MCG/ML (10ML) SYRINGE FOR IV PUSH (FOR BLOOD PRESSURE SUPPORT)
80.0000 ug | PREFILLED_SYRINGE | INTRAVENOUS | Status: DC | PRN
Start: 2015-12-05 — End: 2015-12-05
  Filled 2015-12-05: qty 20

## 2015-12-05 MED ORDER — SIMETHICONE 80 MG PO CHEW: 80.0000 mg | CHEWABLE_TABLET | ORAL | Status: DC | PRN

## 2015-12-05 MED ORDER — OXYCODONE-ACETAMINOPHEN 5-325 MG PO TABS
1.0000 | ORAL_TABLET | ORAL | Status: DC | PRN
  Administered 2015-12-06 (×3): 1 via ORAL
  Filled 2015-12-05 (×3): qty 1

## 2015-12-05 MED ORDER — PHENYLEPHRINE 40 MCG/ML (10ML) SYRINGE FOR IV PUSH (FOR BLOOD PRESSURE SUPPORT): 80.0000 ug | PREFILLED_SYRINGE | INTRAVENOUS | Status: DC | PRN

## 2015-12-05 MED ORDER — ACETAMINOPHEN 500 MG PO TABS
1000.0000 mg | ORAL_TABLET | Freq: Once | ORAL | Status: AC
  Administered 2015-12-05: 1000 mg via ORAL
  Filled 2015-12-05: qty 2

## 2015-12-05 MED ORDER — LIDOCAINE HCL (PF) 1 % IJ SOLN
30.0000 mL | INTRAMUSCULAR | Status: DC | PRN
  Filled 2015-12-05: qty 30

## 2015-12-05 MED ORDER — FENTANYL CITRATE (PF) 100 MCG/2ML IJ SOLN
25.0000 ug | INTRAMUSCULAR | Status: DC | PRN
  Administered 2015-12-05: 50 ug via INTRAVENOUS

## 2015-12-05 MED ORDER — NALBUPHINE HCL 10 MG/ML IJ SOLN: 5.0000 mg | INTRAMUSCULAR | Status: DC | PRN

## 2015-12-05 MED ORDER — IBUPROFEN 600 MG PO TABS: 600.0000 mg | ORAL_TABLET | Freq: Four times a day (QID) | ORAL | Status: DC | PRN

## 2015-12-05 MED ORDER — OXYCODONE-ACETAMINOPHEN 5-325 MG PO TABS
2.0000 | ORAL_TABLET | ORAL | Status: DC | PRN
  Administered 2015-12-06 – 2015-12-07 (×3): 2 via ORAL
  Filled 2015-12-05 (×3): qty 2

## 2015-12-05 MED ORDER — ZOLPIDEM TARTRATE 5 MG PO TABS: 5.0000 mg | ORAL_TABLET | Freq: Every evening | ORAL | Status: DC | PRN

## 2015-12-05 MED ORDER — TERBUTALINE SULFATE 1 MG/ML IJ SOLN: 0.2500 mg | Freq: Once | INTRAMUSCULAR | Status: DC | PRN

## 2015-12-05 MED ORDER — SIMETHICONE 80 MG PO CHEW
80.0000 mg | CHEWABLE_TABLET | Freq: Three times a day (TID) | ORAL | Status: DC
  Administered 2015-12-06 – 2015-12-07 (×3): 80 mg via ORAL
  Filled 2015-12-05 (×3): qty 1

## 2015-12-05 MED ORDER — DIBUCAINE 1 % RE OINT: 1.0000 "application " | TOPICAL_OINTMENT | RECTAL | Status: DC | PRN

## 2015-12-05 MED ORDER — ONDANSETRON HCL 4 MG/2ML IJ SOLN: 4.0000 mg | Freq: Three times a day (TID) | INTRAMUSCULAR | Status: DC | PRN

## 2015-12-05 MED ORDER — TETANUS-DIPHTH-ACELL PERTUSSIS 5-2.5-18.5 LF-MCG/0.5 IM SUSP: 0.5000 mL | Freq: Once | INTRAMUSCULAR | Status: DC

## 2015-12-05 MED ORDER — FENTANYL CITRATE (PF) 100 MCG/2ML IJ SOLN
INTRAMUSCULAR | Status: AC
  Filled 2015-12-05: qty 2

## 2015-12-05 MED ORDER — DIPHENHYDRAMINE HCL 25 MG PO CAPS
25.0000 mg | ORAL_CAPSULE | ORAL | Status: DC | PRN
Start: 2015-12-05 — End: 2015-12-07

## 2015-12-05 MED ORDER — GENTAMICIN SULFATE 40 MG/ML IJ SOLN
170.0000 mg | Freq: Three times a day (TID) | INTRAVENOUS | Status: DC
  Administered 2015-12-05: 170 mg via INTRAVENOUS
  Filled 2015-12-05 (×2): qty 4.25

## 2015-12-05 MED ORDER — INFLUENZA VAC SPLIT QUAD 0.5 ML IM SUSY
0.5000 mL | PREFILLED_SYRINGE | INTRAMUSCULAR | Status: AC
  Administered 2015-12-06: 0.5 mL via INTRAMUSCULAR
  Filled 2015-12-05: qty 0.5

## 2015-12-05 MED ORDER — ACETAMINOPHEN 325 MG PO TABS: 650.0000 mg | ORAL_TABLET | ORAL | Status: DC | PRN

## 2015-12-05 MED ORDER — SIMETHICONE 80 MG PO CHEW
80.0000 mg | CHEWABLE_TABLET | ORAL | Status: DC
  Administered 2015-12-06 – 2015-12-07 (×2): 80 mg via ORAL
  Filled 2015-12-05 (×2): qty 1

## 2015-12-05 MED ORDER — WITCH HAZEL-GLYCERIN EX PADS: 1.0000 "application " | MEDICATED_PAD | CUTANEOUS | Status: DC | PRN

## 2015-12-05 MED ORDER — EPHEDRINE 5 MG/ML INJ: 10.0000 mg | INTRAVENOUS | Status: DC | PRN

## 2015-12-05 MED ORDER — MENTHOL 3 MG MT LOZG: 1.0000 | LOZENGE | OROMUCOSAL | Status: DC | PRN

## 2015-12-05 MED ORDER — ONDANSETRON HCL 4 MG/2ML IJ SOLN
INTRAMUSCULAR | Status: DC | PRN
  Administered 2015-12-05: 4 mg via INTRAVENOUS

## 2015-12-05 MED ORDER — OXYTOCIN 10 UNIT/ML IJ SOLN
1.0000 m[IU]/min | INTRAVENOUS | Status: DC
  Administered 2015-12-05: 2 m[IU]/min via INTRAVENOUS

## 2015-12-05 MED ORDER — KETOROLAC TROMETHAMINE 30 MG/ML IJ SOLN: 30.0000 mg | Freq: Four times a day (QID) | INTRAMUSCULAR | Status: AC | PRN

## 2015-12-05 MED ORDER — OXYTOCIN 10 UNIT/ML IJ SOLN: 2.5000 [IU]/h | INTRAVENOUS | Status: AC

## 2015-12-05 MED ORDER — LACTATED RINGERS IV SOLN
INTRAVENOUS | Status: DC
  Administered 2015-12-06: 02:00:00 via INTRAVENOUS

## 2015-12-05 MED ORDER — SODIUM CHLORIDE 0.9% FLUSH
3.0000 mL | INTRAVENOUS | Status: DC | PRN
  Administered 2015-12-06: 3 mL via INTRAVENOUS
  Filled 2015-12-05: qty 3

## 2015-12-05 MED ORDER — SODIUM BICARBONATE 8.4 % IV SOLN
INTRAVENOUS | Status: DC | PRN
  Administered 2015-12-05 (×3): 5 mL via EPIDURAL

## 2015-12-05 MED ORDER — FENTANYL 2.5 MCG/ML BUPIVACAINE 1/10 % EPIDURAL INFUSION (WH - ANES)
14.0000 mL/h | INTRAMUSCULAR | Status: DC | PRN
  Administered 2015-12-05 (×2): 14 mL/h via EPIDURAL
  Filled 2015-12-05 (×2): qty 125

## 2015-12-05 MED ORDER — SENNOSIDES-DOCUSATE SODIUM 8.6-50 MG PO TABS
2.0000 | ORAL_TABLET | ORAL | Status: DC
  Administered 2015-12-06 – 2015-12-07 (×2): 2 via ORAL
  Filled 2015-12-05 (×2): qty 2

## 2015-12-05 MED ORDER — MORPHINE SULFATE (PF) 0.5 MG/ML IJ SOLN
INTRAMUSCULAR | Status: DC | PRN
  Administered 2015-12-05: 3 mg via EPIDURAL

## 2015-12-05 MED ORDER — SODIUM CHLORIDE 0.9 % IV SOLN
2.0000 g | Freq: Four times a day (QID) | INTRAVENOUS | Status: DC
  Administered 2015-12-05: 2 g via INTRAVENOUS
  Filled 2015-12-05 (×3): qty 2000

## 2015-12-05 MED ORDER — NALOXONE HCL 0.4 MG/ML IJ SOLN: 0.4000 mg | INTRAMUSCULAR | Status: DC | PRN

## 2015-12-05 MED ORDER — CITRIC ACID-SODIUM CITRATE 334-500 MG/5ML PO SOLN
30.0000 mL | ORAL | Status: DC | PRN
  Administered 2015-12-05: 30 mL via ORAL
  Filled 2015-12-05: qty 15

## 2015-12-05 MED ORDER — OXYTOCIN BOLUS FROM INFUSION
500.0000 mL | INTRAVENOUS | Status: DC
Start: 2015-12-05 — End: 2015-12-05

## 2015-12-05 MED ORDER — OXYTOCIN 10 UNIT/ML IJ SOLN
INTRAMUSCULAR | Status: AC
  Filled 2015-12-05: qty 4

## 2015-12-05 MED ORDER — OXYTOCIN 10 UNIT/ML IJ SOLN
40.0000 [IU] | INTRAVENOUS | Status: DC | PRN
  Administered 2015-12-05: 40 [IU] via INTRAVENOUS

## 2015-12-05 MED ORDER — OXYCODONE-ACETAMINOPHEN 5-325 MG PO TABS: 1.0000 | ORAL_TABLET | ORAL | Status: DC | PRN

## 2015-12-05 MED ORDER — LANOLIN HYDROUS EX OINT: 1.0000 "application " | TOPICAL_OINTMENT | CUTANEOUS | Status: DC | PRN

## 2015-12-05 MED ORDER — LACTATED RINGERS IV SOLN
INTRAVENOUS | Status: DC
  Administered 2015-12-05 (×4): via INTRAVENOUS

## 2015-12-05 MED ORDER — ONDANSETRON HCL 4 MG/2ML IJ SOLN: 4.0000 mg | Freq: Four times a day (QID) | INTRAMUSCULAR | Status: DC | PRN

## 2015-12-05 MED ORDER — LIDOCAINE-EPINEPHRINE (PF) 2 %-1:200000 IJ SOLN
INTRAMUSCULAR | Status: AC
  Filled 2015-12-05: qty 20

## 2015-12-05 SURGICAL SUPPLY — 32 items
CLAMP CORD UMBIL (MISCELLANEOUS) IMPLANT
CLOTH BEACON ORANGE TIMEOUT ST (SAFETY) ×3 IMPLANT
DRSG OPSITE POSTOP 4X10 (GAUZE/BANDAGES/DRESSINGS) ×3 IMPLANT
DURAPREP 26ML APPLICATOR (WOUND CARE) ×3 IMPLANT
ELECT REM PT RETURN 9FT ADLT (ELECTROSURGICAL) ×3
ELECTRODE REM PT RTRN 9FT ADLT (ELECTROSURGICAL) ×1 IMPLANT
EXTRACTOR VACUUM M CUP 4 TUBE (SUCTIONS) IMPLANT
EXTRACTOR VACUUM M CUP 4' TUBE (SUCTIONS)
GLOVE BIOGEL PI IND STRL 6.5 (GLOVE) ×1 IMPLANT
GLOVE BIOGEL PI IND STRL 7.0 (GLOVE) ×1 IMPLANT
GLOVE BIOGEL PI INDICATOR 6.5 (GLOVE) ×2
GLOVE BIOGEL PI INDICATOR 7.0 (GLOVE) ×2
GLOVE ECLIPSE 6.5 STRL STRAW (GLOVE) ×3 IMPLANT
GOWN STRL REUS W/TWL LRG LVL3 (GOWN DISPOSABLE) ×6 IMPLANT
KIT ABG SYR 3ML LUER SLIP (SYRINGE) IMPLANT
NEEDLE HYPO 25X5/8 SAFETYGLIDE (NEEDLE) IMPLANT
NS IRRIG 1000ML POUR BTL (IV SOLUTION) ×3 IMPLANT
PACK C SECTION WH (CUSTOM PROCEDURE TRAY) ×3 IMPLANT
PAD ABD 7.5X8 STRL (GAUZE/BANDAGES/DRESSINGS) IMPLANT
PAD OB MATERNITY 4.3X12.25 (PERSONAL CARE ITEMS) ×3 IMPLANT
PENCIL SMOKE EVAC W/HOLSTER (ELECTROSURGICAL) ×3 IMPLANT
RTRCTR C-SECT PINK 25CM LRG (MISCELLANEOUS) ×3 IMPLANT
STAPLER VISISTAT 35W (STAPLE) ×3 IMPLANT
SUT MON AB 2-0 CT1 27 (SUTURE) ×3 IMPLANT
SUT MON AB 4-0 PS1 27 (SUTURE) IMPLANT
SUT PDS AB 0 CTX 60 (SUTURE) IMPLANT
SUT PLAIN 2 0 XLH (SUTURE) IMPLANT
SUT VIC AB 0 CTX 36 (SUTURE) ×12
SUT VIC AB 0 CTX36XBRD ANBCTRL (SUTURE) ×4 IMPLANT
SUT VIC AB 4-0 KS 27 (SUTURE) IMPLANT
TOWEL OR 17X24 6PK STRL BLUE (TOWEL DISPOSABLE) ×3 IMPLANT
TRAY FOLEY CATH SILVER 14FR (SET/KITS/TRAYS/PACK) ×3 IMPLANT

## 2015-12-05 NOTE — MAU Note (Signed)
PT  SAYS SSHE HURT  BAD SINCE  11PM.  VE ON Tuesday   3-4  CM.   DENIES HSV AND MRSA.    GBS- NEG

## 2015-12-05 NOTE — MAU Note (Signed)
Pt reports contractions x 5 hours.

## 2015-12-05 NOTE — Transfer of Care (Signed)
Immediate Anesthesia Transfer of Care Note  Patient: [name]  Procedure(s) Performed: Procedure(s): CESAREAN SECTION (N/A)  Patient Location: PACU  Anesthesia Type:Epidural  Level of Consciousness: awake, alert  and oriented  Airway & Oxygen Therapy: Patient Spontanous Breathing  Post-op Assessment: Report given to RN and Post -op Vital signs reviewed and stable  Post vital signs: Reviewed and stable  Last Vitals:  Filed Vitals:   12/05/15 1441 12/05/15 1442  BP: 119/58   Pulse: 133 136  Temp:    Resp:      Complications: No apparent anesthesia complications

## 2015-12-05 NOTE — Progress Notes (Signed)
Patient pushed for approximately 1 hr with minimal descent of head and development of some caput.  Pt stated she wished she had just had a repeat c-section.  The option of c/s without further pushing ws discussed along with Risks,benefits, complications including possible difficulty of delivery of impacted head and pt stated she understood and prefers to proceed with elective repeat c/s.  Amp/Gent started for maternal fever and fetal tachycardia, Clinda added for surgical prophylaxis.

## 2015-12-05 NOTE — Consult Note (Addendum)
Neonatology Note:   Attendance at C-section:    I was asked by Dr. Claiborne Billingsallahan to attend this repeat C/S at 38 1/7 weeks after TOLAC, FTP. The mother is a G2P1 B pos, GBS neg with some elevation of BP and possible chorioamnionitis.Marland Kitchen. ROM 6 hours prior to delivery, fluid clear. Highest maternal temperature was 100.5 degrees prior to delivery. She got a dose of Ampicillin and Gentamicin 1 hour before delivery and a dose of Clindamycin at C-section. Infant vigorous with good spontaneous cry and tone. Needed only minimal bulb suctioning. Ap 9/9. Lungs clear to ausc in DR. To CN to care of Pediatrician.  [facility] the [facility] early[facility] risk calculator, this well-appearing infant has a risk of 0.28/1000 and would not need any lab evaluation at this time, only routine newborn care. However, if his clinical status changes, he would require evaluation.   Doretha Souhristie C. Elana Jian, MD

## 2015-12-05 NOTE — Addendum Note (Signed)
Addendum  created 12/05/15 1811 by Elgie CongoNataliya H Pearly Apachito, CRNA   Modules edited: Clinical Notes   Clinical Notes:  File: [id number]

## 2015-12-05 NOTE — Anesthesia Preprocedure Evaluation (Addendum)
Anesthesia Evaluation  Patient identified by MRN, date of birth, ID band Patient awake    Reviewed: Allergy & Precautions, H&P , Patient's Chart, lab work & pertinent test results  Airway Mallampati: II  TM Distance: >3 FB Neck ROM: full    Dental  (+) Teeth Intact   Pulmonary    breath sounds clear to auscultation       Cardiovascular  Rhythm:regular Rate:Normal     Neuro/Psych    GI/Hepatic   Endo/Other    Renal/GU      Musculoskeletal   Abdominal   Peds  Hematology   Anesthesia Other Findings       Reproductive/Obstetrics (+) Pregnancy                             Anesthesia Physical Anesthesia Plan  ASA: II and emergent  Anesthesia Plan: Epidural   Post-op Pain Management:    Induction:   Airway Management Planned: Natural Airway  Additional Equipment:   Intra-op Plan:   Post-operative Plan:   Informed Consent: I have reviewed the patients History and Physical, chart, labs and discussed the procedure including the risks, benefits and alternatives for the proposed anesthesia with the patient or authorized representative who has indicated his/her understanding and acceptance.   Dental Advisory Given  Plan Discussed with: Anesthesiologist, CRNA and Surgeon  Anesthesia Plan Comments: (Labs checked- platelets confirmed with RN in room. Fetal heart tracing, per RN, reported to be stable enough for sitting procedure. Discussed epidural, and patient consents to the procedure:  included risk of possible headache,backache, failed block, allergic reaction, and nerve injury. This patient was asked if she had any questions or concerns before the procedure started.   Patient for C/Section for arrest of descent. Will use epidural for C/section. M. Shawanna Zanders,MD)       Anesthesia Quick Evaluation

## 2015-12-05 NOTE — Brief Op Note (Signed)
12/05/2015  3:49 PM  PATIENT:  [name]  30 y.o. female  PRE-OPERATIVE DIAGNOSIS:  Repeat Elective Cesarean Section [facility]  POST-OPERATIVE DIAGNOSIS: same  PROCEDURE:  Procedure(s): CESAREAN SECTION (N/A) - low transverse  SURGEON:  Surgeon(s) and Role:    * Philip AspenSidney Lucifer Soja, DO - Primary    * Kathrynn RunningNoah Bedford Wouk, MD - Assisting  ANESTHESIA:   epidural  EBL:  Total I/O In: 900 [I.V.:900] Out: 1575 [Urine:450; Emesis/NG output:425; Blood:700]  SPECIMEN:  Source of Specimen:  cord blood, placenta  DISPOSITION OF SPECIMEN:  PATHOLOGY  COUNTS:  YES  PLAN OF CARE: Admit to inpatient   PATIENT DISPOSITION:  PACU - hemodynamically stable.   Delay start of Pharmacological VTE agent (>24hrs) due to surgical blood loss or risk of bleeding: not applicable

## 2015-12-05 NOTE — Consults (Signed)
  Anesthesia Pain Consult Note  Patient: [name], 30 y.o., female  Consult Requested by: Marlow Baarsyanna Clark, MD  Reason for Consult: CRNA Pain Management Epidural placed Pain 3 Goal 7

## 2015-12-05 NOTE — Addendum Note (Signed)
Addendum  created 12/05/15 1716 by Mal AmabileMichael Shahad Mazurek, MD   Modules edited: Anesthesia Attestations

## 2015-12-05 NOTE — Anesthesia Postprocedure Evaluation (Signed)
Anesthesia Post Note  Patient: [name]  Procedure(s) Performed: Procedure(s) (LRB): CESAREAN SECTION (N/A)  Patient location during evaluation: Mother Baby Anesthesia Type: Epidural Level of consciousness: awake, awake and alert and oriented Pain management: pain level controlled Vital Signs Assessment: post-procedure vital signs reviewed and stable Respiratory status: spontaneous breathing Cardiovascular status: stable Postop Assessment: adequate PO intake and no signs of nausea or vomiting Anesthetic complications: no    Last Vitals:  Filed Vitals:   12/05/15 1711 12/05/15 1729  BP:  153/80  Pulse: 106 108  Temp:  36.8 C  Resp: 21 20    Last Pain:  Filed Vitals:   12/05/15 1802  PainSc: 0-No pain                 Raisa Ditto Hristova

## 2015-12-05 NOTE — Progress Notes (Signed)
ANTIBIOTIC CONSULT NOTE - INITIAL  Pharmacy Consult for Gentamicin Indication: Chorioamnionitis   No Known Allergies  Patient Measurements: Height: 5\' 4"  (162.6 cm) Weight: 181 lb (82.101 kg) IBW/kg (Calculated) : 54.7 Adjusted Body Weight: 62.9  Vital Signs: Temp: 100.5 F (38.1 C) (03/10 1356) Temp Source: Axillary (03/10 1356) BP: 141/81 mmHg (03/10 1401) Pulse Rate: 119 (03/10 1402)  Labs:  Recent Labs  12/05/15 0450 12/05/15 0933  WBC 11.6*  --   HGB 13.3  --   PLT 135*  --   CREATININE  --  0.57   No results for input(s): GENTTROUGH, GENTPEAK, GENTRANDOM in the last 72 hours.   Microbiology: Recent Results (from the past 720 hour(s))  OB RESULT CONSOLE Group B Strep     Status: None   Collection Time: 11/14/15 12:00 AM  Result Value Ref Range Status   GBS Negative  Final    Medications:  Ampicillin 2g IV q6h  Assessment: 30 y.o. female G2P1001 at 6627w1d  Estimated Ke = 0.377, Vd = 25.2 L  Goal of Therapy:  Gentamicin peak 6-8 mg/L and Trough < 1 mg/L  Plan:   Gentamicin 170 mg IV every 8 hrs  Check Scr with next labs if gentamicin continued. Will check gentamicin levels if continued > 72hr or clinically indicated.  [name], [name] 12/05/2015,2:18 PM

## 2015-12-05 NOTE — Anesthesia Postprocedure Evaluation (Signed)
Anesthesia Post Note  Patient: [name]  Procedure(s) Performed: Procedure(s) (LRB): CESAREAN SECTION (N/A)  Patient location during evaluation: PACU Anesthesia Type: Epidural Level of consciousness: awake and alert and oriented Pain management: pain level controlled Vital Signs Assessment: post-procedure vital signs reviewed and stable Respiratory status: spontaneous breathing, nonlabored ventilation and respiratory function stable Cardiovascular status: blood pressure returned to baseline and stable Postop Assessment: no signs of nausea or vomiting, no headache, no backache, epidural receding and patient able to bend at knees Anesthetic complications: no    Last Vitals:  Filed Vitals:   12/05/15 1659 12/05/15 1700  BP:    Pulse: 102 101  Temp:    Resp: 22 25    Last Pain:  Filed Vitals:   12/05/15 1703  PainSc: 0-No pain                 [name] A.

## 2015-12-05 NOTE — H&P (Signed)
30 y.o. 75107w1d  G2P1001 comes in c/o ctx.  Otherwise has good fetal movement and no bleeding.  Past Medical History  Diagnosis Date  . No pertinent past medical history     Past Surgical History  Procedure Laterality Date  . No past surgeries    . Cesarean section  10/19/2012    Procedure: CESAREAN SECTION;  Surgeon: Levi AlandMark E Anderson, MD;  Location: [facility];  Service: Obstetrics;  Laterality: N/A;    OB History  Gravida Para Term Preterm AB SAB TAB Ectopic Multiple Living  2 1 1       1     # Outcome Date GA Lbr Len/2nd Weight Sex Delivery Anes PTL Lv  2 Current           1 Term 10/19/12 10524w6d 19:21 / 00:23  M CS-LVertical EPI  Y      Social History   Social History  . Marital Status: Single    Spouse Name: N/A  . Number of Children: N/A  . Years of Education: N/A   Occupational History  . Not on file.   Social History Main Topics  . Smoking status: Never Smoker   . Smokeless tobacco: Never Used  . Alcohol Use: No  . Drug Use: No  . Sexual Activity: Yes    Birth Control/ Protection: None   Other Topics Concern  . Not on file   Social History Narrative   Review of patient's allergies indicates no known allergies.    Prenatal Transfer Tool  Maternal Diabetes: No Genetic Screening: Normal Maternal Ultrasounds/Referrals: Normal Fetal Ultrasounds or other Referrals:  None Maternal Substance Abuse:  No Significant Maternal Medications:  None Significant Maternal Lab Results: Lab values include: Group B Strep negative  Other PNC: uncomplicated.    Filed Vitals:   12/05/15 0731 12/05/15 0801  BP: 140/86 132/87  Pulse: 111 112  Temp:    Resp:       Lungs/Cor:  NAD Abdomen:  soft, gravid Ex:  no cords, erythema SVE:  6/90/-1 FHTs:  150, good STV, NST R Toco:  q3-5   A/P   Admitted with labor  GBS Neg  Now 8/100/-1, bulging bag, will AROM  Other routine care  PolandALLAHAN, Neyra Pettie

## 2015-12-05 NOTE — Anesthesia Procedure Notes (Signed)
Epidural Patient location during procedure: OB  Preanesthetic Checklist Completed: patient identified, site marked, surgical consent, pre-op evaluation, timeout performed, IV checked, risks and benefits discussed and monitors and equipment checked  Epidural Patient position: sitting Prep: site prepped and draped and DuraPrep Patient monitoring: continuous pulse ox and blood pressure Approach: right paramedian Location: L3-L4 Injection technique: LOR air  Needle:  Needle type: Tuohy  Needle gauge: 17 G Needle length: 9 cm and 9 Needle insertion depth: 5 cm cm Catheter type: closed end flexible Catheter size: 19 Gauge Catheter at skin depth: 10 cm Test dose: negative  Assessment Events: blood not aspirated, injection not painful, no injection resistance, negative IV test and no paresthesia  Additional Notes Dosing of Epidural:  1st dose, through catheter ............................................Marland Kitchen.  Xylocaine 40 mg  2nd dose, through catheter, after waiting 3 minutes........Marland Kitchen.Xylocaine 60 mg    As each dose occurred, patient was free of IV sx; and patient exhibited no evidence of SA injection.  Patient is more comfortable after epidural dosed. Please see RN's note for documentation of vital signs,and FHR which are stable.  Patient reminded not to try to ambulate with numb legs, and that an RN must be present when she attempts to get up.

## 2015-12-06 LAB — CBC
HEMATOCRIT: 35.9 % — AB (ref 36.0–46.0)
Hemoglobin: 11.7 g/dL — ABNORMAL LOW (ref 12.0–15.0)
MCH: 25.5 pg — AB (ref 26.0–34.0)
MCHC: 32.6 g/dL (ref 30.0–36.0)
MCV: 78.4 fL (ref 78.0–100.0)
PLATELETS: 125 10*3/uL — AB (ref 150–400)
RBC: 4.58 MIL/uL (ref 3.87–5.11)
RDW: 16.8 % — ABNORMAL HIGH (ref 11.5–15.5)
WBC: 14.1 10*3/uL — ABNORMAL HIGH (ref 4.0–10.5)

## 2015-12-06 NOTE — Lactation Note (Signed)
This note was copied from a baby's chart. Lactation Consultation Note  Patient Name: [name] Reason for consult: Initial assessment  Baby 18 hours old. Mom reports that baby nursing very well. Mom states baby latching deeply and she is hearing swallows. Mom BF first child for a year with no problems. Mom enc to nurse with cues and support baby's head well while at the breast. [facility], aware of OP/BFSG and LC phone line assistance after D/C.  Maternal Data Does the patient have breastfeeding experience prior to this delivery?: Yes  Feeding Feeding Type: Breast Fed Length of feed: 15 min  LATCH Score/Interventions                      Lactation Tools Discussed/Used     Consult Status Consult Status: Follow-up Date: 12/07/15 Follow-up type: In-patient    Geralynn OchsWILLIARD, [name], 9:41 AM

## 2015-12-06 NOTE — Progress Notes (Signed)
Patient is eating, ambulating, voiding.  Pain control is good.  Appropriate lochia, no complaints.  Filed Vitals:   12/06/15 0015 12/06/15 0435 12/06/15 0558 12/06/15 0830  BP: 140/76 124/78 117/72 135/78  Pulse: 115 98 103 104  Temp: 98.7 F (37.1 C) 98.5 F (36.9 C) 98.8 F (37.1 C) 98.4 F (36.9 C)  TempSrc: Oral Oral Oral   Resp: 18 18 17 18   Height:      Weight:      SpO2: 95% 97%  100%    [employer]: c/d/i Ext: no CT  Lab Results  Component Value Date   WBC 14.1* 12/06/2015   HGB 11.7* 12/06/2015   HCT 35.9* 12/06/2015   MCV 78.4 12/06/2015   PLT 125* 12/06/2015    --/--/B POS (03/10 0450)  A/P Post op day #1 s/p R c/s after failed TOLAC Doing well circ desired, consent obtained.  Routine care.  Expect d/c 3/12.    Philip AspenALLAHAN, Hernandez [place]asso

## 2015-12-07 ENCOUNTER — Encounter (HOSPITAL_COMMUNITY): Payer: Self-pay | Admitting: Obstetrics and Gynecology

## 2015-12-07 MED ORDER — OXYCODONE-ACETAMINOPHEN 5-325 MG PO TABS
2.0000 | ORAL_TABLET | ORAL | Status: DC | PRN
Start: 2015-12-07 — End: 2018-04-22

## 2015-12-07 NOTE — Lactation Note (Signed)
This note was copied from a baby's chart. Lactation Consultation Note  Patient Name: [name]: 12/07/2015 Reason for consult: Follow-up assessment   Follow up with exp BF mom of 45 hour old infant. Infant with 11 BF for 10-25 minutes, 2 voids and 7 stools in last 24 hours. Infant weight 7 lb 4.9 oz with a 6% weight loss since birth.  Mom had infant latched in football hold to right breast and feeding when I went into the room. Infant was positioned well and rhythmically sucking.   Mom reports nipple tenderness with initial latch that improves. Enc mom to express EBM and allow to air dry to nipples.  [facility] and plans to call tomorrow to make followup appt. Mom is to call in am for Ped f/u appt. She is aware that BF infants need to be seen within 1-2 days of d/c.   Mom has manual pump at home for use. She denies questions at this time. All BF information reviewed in Taking Care of Baby and Me Booklet. Engorgement prevention/treatment reviewed. Enc mom to maintain I/O record and take to Ped visit. [facility] reviewed, mom aware of OP Services, BF Support Groups and LC phone #. Enc mom to call with questions/concerns prn   Maternal Data Formula Feeding for Exclusion: No Does the patient have breastfeeding experience prior to this delivery?: Yes  Feeding Length of feed: 14 min  LATCH Score/Interventions Latch: Grasps breast easily, tongue down, lips flanged, rhythmical sucking.  Audible Swallowing: Spontaneous and intermittent  Type of Nipple: Everted at rest and after stimulation  Comfort (Breast/Nipple): Soft / non-tender     Hold (Positioning): No assistance needed to correctly position infant at breast.  LATCH Score: 10  Lactation Tools Discussed/Used [facility] Yes Pump Review: Setup, frequency, and cleaning;Milk Storage   Consult Status Consult Status: Complete Follow-up type: Call as needed    Ed BlalockSharon S Jerell Demery 12/07/2015, 12:56  PM

## 2015-12-07 NOTE — Discharge Summary (Signed)
Obstetric Discharge Summary Reason for Admission: onset of labor, TOLAC Prenatal Procedures: ultrasound Intrapartum Procedures: cesarean: low cervical, transverse Postpartum Procedures: none Complications-Operative and Postpartum: none HEMOGLOBIN  Date Value Ref Range Status  12/06/2015 11.7* 12.0 - 15.0 g/dL Final   HCT  Date Value Ref Range Status  12/06/2015 35.9* 36.0 - 46.0 % Final    Physical Exam:  General: alert and cooperative Lochia: appropriate Uterine Fundus: firm Incision: healing well, no significant drainage DVT Evaluation: No evidence of DVT seen on physical exam.  Discharge Diagnoses: Term Pregnancy-delivered  Discharge Information: Date: 12/07/2015 Activity: pelvic rest Diet: routine Medications: PNV, Ibuprofen and Percocet Condition: stable Instructions: refer to practice specific booklet Discharge to: home Follow-up Information    Follow up with Yamen Castrogiovanni, DO In 2 weeks.   Specialty:  Obstetrics and Gynecology   Contact information:   [address] [place] [id number] [phone]       Newborn Data: Live born female  Birth Weight: 7 lb 12.5 oz (3530 g) APGAR: 9, 9  Home with mother.  Philip AspenCALLAHAN, Rhanda Lemire 12/07/2015, 10:22 AM

## 2015-12-08 NOTE — Op Note (Signed)
[name], [name]                    ACCOUNT NO.:  [id number]  MEDICAL RECORD NO.:  [id number]  LOCATION:  9148                          FACILITY:  [facility]  PHYSICIAN:  Philip Aspen, DO    DATE OF BIRTH:  Sep 03, 1986  DATE OF PROCEDURE:  12/05/2015 DATE OF DISCHARGE:  12/07/2015                              OPERATIVE REPORT   PREOPERATIVE DIAGNOSIS:  Elective repeat cesarean section after failed attempt at vaginal delivery.  POSTOPERATIVE DIAGNOSIS:  Elective repeat cesarean section after failed attempt at vaginal delivery.  PROCEDURE:  Low-transverse cesarean section.  SURGEON:  Philip Aspen, DO.  ASSISTANT:  Shonna Chock, MD.  ANESTHESIA:  Epidural.  IV FLUIDS:  900 mL.  URINE OUTPUT:  450 mL.  EMESIS:  425 mL.  ESTIMATED BLOOD LOSS:  700 mL.  SPECIMENS:  Cord blood and placenta.  COMPLICATIONS:  None.  CONDITION:  Stable to PACU.  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  Normal tubes and ovaries bilaterally.  Female infant in cephalic presentation with Apgars of 9 and 9 and weight of 7 pounds 12 ounces.  DESCRIPTION OF PROCEDURE:  The patient was taken to the operating room, where epidural anesthesia was found to be adequate.  She was prepped and draped in the normal sterile fashion in dorsal supine position. Pfannenstiel skin incision was made with a scalpel and carried down to the underlying layer with Bovie cautery.  The fascia was incised with a scalpel and extended laterally with Mayo scissors.  Kocher clamps were placed at the superior aspect of the fascial incision and rectus muscles were then dissected off bluntly and sharply.  Kocher clamps were then placed at the inferior aspect of the fascial incision, and again rectus muscles were dissected off bluntly and sharply.  Hemostat was used to separate the rectus muscles superiorly.  Good visualization of the peritoneum and underlying  uterus was found and the peritoneum was entered bluntly.  The peritoneal incision was extended by lateral traction, as well as Bovie cautery to incise the midline tissue with good visualization of bladder.  The above findings were noted and Metzenbaum scissors were used to carefully dissect all filmy adhesions to the bladder to the uterus.  The vesicouterine peritoneum was then incised with Metzenbaum scissors, extended laterally, and a bladder flap was developed visually.  The Alexis self-retractor was placed and a scalpel was used to create a low-transverse cesarean incision.  The infant's head was located deep in the pelvis.  Nursing staff helped with elevation of the head from below and the infant's head was elevated in a flexed position and delivered from the uterine incision with some difficulty followed by the remainder of its body.  The cord was clamped and cut and the infant was handed off to awaiting neonatology. An attempt at cord blood gas collection was made by handing off a segment of the umbilical cord, but by the time it was drawn, the blood had clotted.  Additional blood was collected for cord blood assessment. External uterine massage was performed on the uterus and gentle traction on the umbilical cord facilitated delivery  of the placenta.  The uterus was cleared of all clot and debris and the uterine incision was closed with 0 Vicryl in a running locked fashion.  Second layer of horizontal Lembert imbrication was performed and excellent hemostasis was noted with 1 additional figure-of-eight placed just to the right of midline.   The [employer] self-retractor was removed and the uterine, bladder surfaces, the muscular, and fascial surfaces and subcutaneous tissue were all visualized and excellent hemostasis was noted.  Due to adhesive disease, it was not possible to adequately identify and reapproximate the peritoneal tissue.  Therefore, the fascia was reapproximated and  closed with 0 Vicryl in a running fashion.  Subcutaneous tissue was irrigated, dried, and minimal use of Bovie cautery was needed to control small bleeders.  The skin was reapproximated and closed with staples.  The patient tolerated the procedure well.  Sponge, lap, and needle counts were correct x2.  The patient was taken to recovery in stable condition.          ______________________________ Philip AspenSidney Maren Wiesen, DO     [place]/MEDQ  D:  12/07/2015  T:  12/07/2015  Job:  [id number]

## 2016-02-02 ENCOUNTER — Telehealth (HOSPITAL_COMMUNITY): Payer: Self-pay | Admitting: Obstetrics

## 2018-04-22 ENCOUNTER — Ambulatory Visit (INDEPENDENT_AMBULATORY_CARE_PROVIDER_SITE_OTHER): Payer: Self-pay

## 2018-04-22 ENCOUNTER — Ambulatory Visit (HOSPITAL_COMMUNITY)
Admission: EM | Admit: 2018-04-22 | Discharge: 2018-04-22 | Disposition: A | Discharge status: Home / Self Care | Payer: Self-pay | Attending: Internal Medicine | Admitting: Internal Medicine

## 2018-04-22 ENCOUNTER — Other Ambulatory Visit: Payer: Self-pay

## 2018-04-22 ENCOUNTER — Encounter (HOSPITAL_COMMUNITY): Payer: Self-pay | Admitting: *Deleted

## 2018-04-22 DIAGNOSIS — S46912A Strain of unspecified muscle, fascia and tendon at shoulder and upper arm level, left arm, initial encounter: Secondary | ICD-10-CM

## 2018-04-22 MED ORDER — IBUPROFEN 600 MG PO TABS: 600.0000 mg | ORAL_TABLET | Freq: Three times a day (TID) | ORAL | 0 refills | Status: DC | PRN

## 2018-04-22 MED ORDER — CYCLOBENZAPRINE HCL 5 MG PO TABS: 5.0000 mg | ORAL_TABLET | Freq: Two times a day (BID) | ORAL | 0 refills | Status: DC | PRN

## 2018-04-22 NOTE — Discharge Instructions (Signed)
Use anti-inflammatories for pain/swelling. You may take up to 600-800 mg Ibuprofen every 8 hours with food. You may supplement Ibuprofen with Tylenol [phone] mg every 8 hours.   You may use flexeril as needed to help with pain. This is a muscle relaxer and causes sedation- please use only at bedtime or when you will be home and not have to drive/work  Ice shoulder  Avoid heavy lifting with shoulder  Please follow up with orthopedics if symptoms persisting/worsening with treatment

## 2018-04-22 NOTE — ED Triage Notes (Signed)
Pt here d/t mild aching left shoulder pain that started 1 month ago. Pt denies injury to shoulder.

## 2018-04-22 NOTE — ED Provider Notes (Signed)
[facility]    CSN: [id number] Arrival date & time: 04/22/18  1022     History   Chief Complaint Chief Complaint  Patient presents with  . Shoulder Pain    left    HPI [name] is a 32 y.o. female no history of past medical history presenting today for evaluation of left shoulder pain.  Patient states that for the past month she has had worsening shoulder pain.  Occasional numbness that goes into her left arm/hand.  She denies any specific injury or overuse of her arm.  Patient is right-handed.  Denies any specific motions that are more difficult for her.  Taken any medicines for this as she is breast-feeding.  Denies previous issues with her shoulder.  Occasionally does feel weakness with certain movements.  HPI  Past Medical History:  Diagnosis Date  . No pertinent past medical history     Patient Active Problem List   Diagnosis Date Noted  . Active labor 12/05/2015    Past Surgical History:  Procedure Laterality Date  . CESAREAN SECTION  10/19/2012   Procedure: CESAREAN SECTION;  Surgeon: Levi Aland, MD;  Location: [facility];  Service: Obstetrics;  Laterality: N/A;  . CESAREAN SECTION N/A 12/05/2015   Procedure: CESAREAN SECTION;  Surgeon: Philip Aspen, DO;  Location: [facility];  Service: Obstetrics;  Laterality: N/A;  . NO PAST SURGERIES      OB History    Gravida  2   Para  2   Term  2   Preterm      AB      Living  2     SAB      TAB      Ectopic      Multiple  0   Live Births  2            Home Medications    Prior to Admission medications   Medication Sig Start Date End Date Taking? Authorizing Provider  tetrahydrozoline 0.05 % ophthalmic solution Place 1 drop into both eyes daily as needed.   Yes [provider]  cyclobenzaprine (FLEXERIL) 5 MG tablet Take 1 tablet (5 mg total) by mouth 2 (two) times daily as needed for muscle spasms. 04/22/18   Zackry Deines C, PA-C  ibuprofen (ADVIL,MOTRIN) 600 MG tablet Take  1 tablet (600 mg total) by mouth every 8 (eight) hours as needed. 04/22/18   Katelyne Galster, Junius Creamer, PA-C    Family History Family History  Problem Relation Age of Onset  . Other Neg Hx     Social History Social History   Tobacco Use  . Smoking status: Never Smoker  . Smokeless tobacco: Never Used  Substance Use Topics  . Alcohol use: No  . Drug use: No     Allergies   Patient has no known allergies.   Review of Systems Review of Systems  Constitutional: Negative for fatigue and fever.  Eyes: Negative for visual disturbance.  Respiratory: Negative for shortness of breath.   Cardiovascular: Negative for chest pain.  Gastrointestinal: Negative for abdominal pain, nausea and vomiting.  Musculoskeletal: Positive for arthralgias and myalgias. Negative for gait problem, joint swelling, neck pain and neck stiffness.  Skin: Negative for color change, rash and wound.  Neurological: Negative for dizziness, weakness, light-headedness and headaches.     Physical Exam Triage Vital Signs ED Triage Vitals  Enc Vitals Group     BP 04/22/18 1109 137/84     Pulse Rate 04/22/18 1109  89     Resp 04/22/18 1109 18     Temp 04/22/18 1109 99.1 F (37.3 C)     Temp Source 04/22/18 1109 Oral     SpO2 04/22/18 1109 99 %     Weight 04/22/18 1120 160 lb (72.6 kg)     Height --      Head Circumference --      Peak Flow --      Pain Score 04/22/18 1119 4     Pain Loc --      Pain Edu? --      Excl. in GC? --    No data found.  Updated Vital Signs BP 137/84 (BP Location: Left Arm)   Pulse 89   Temp 99.1 F (37.3 C) (Oral)   Resp 18   Wt 160 lb (72.6 kg)   LMP 04/22/2018   SpO2 99%   BMI 27.46 kg/m   Visual Acuity Right Eye Distance:   Left Eye Distance:   Bilateral Distance:    Right Eye Near:   Left Eye Near:    Bilateral Near:     Physical Exam  Constitutional: She is oriented to person, place, and time. She appears well-developed and well-nourished.  No acute distress    HENT:  Head: Normocephalic and atraumatic.  Nose: Nose normal.  Eyes: Conjunctivae are normal.  Neck: Neck supple.  Cardiovascular: Normal rate.  Pulmonary/Chest: Effort normal. No respiratory distress.  Abdominal: She exhibits no distension.  Musculoskeletal: Normal range of motion.  Full active range of motion at shoulder, no obvious swelling or deformity, tenderness to palpation overlying left trapezius muscle, nontender to palpation over clavicle, acromion and AC joint.  Strength 5/5 and equal bilaterally.  Negative Hawkins, negative Yergason's, negative passive arc, negative liftoff, weakly positive empty can  Neurological: She is alert and oriented to person, place, and time.  Skin: Skin is warm and dry.  Psychiatric: She has a normal mood and affect.  Nursing note and vitals reviewed.    UC Treatments / Results  Labs (all labs ordered are listed, but only abnormal results are displayed) Labs Reviewed - No data to display  EKG None  Radiology Dg Shoulder Left  Result Date: 04/22/2018 CLINICAL DATA:  Left shoulder pain for 1 month. No reported injury. Intermittent hand numbness. EXAM: LEFT SHOULDER - 2+ VIEW COMPARISON:  None. FINDINGS: There is no evidence of fracture or dislocation. There is no evidence of arthropathy or other focal bone abnormality. Soft tissues are unremarkable. IMPRESSION: Negative. Electronically Signed   By: Delbert Phenix M.D.   On: 04/22/2018 12:04    Procedures Procedures (including critical care time)  Medications Ordered in UC Medications - No data to display  Initial Impression / Assessment and Plan / UC Course  I have reviewed the triage vital signs and the nursing notes.  Pertinent labs & imaging results that were available during my care of the patient were reviewed by me and considered in my medical decision making (see chart for details).     Discussed with patient symptoms likely muscular strain/tension and trapezius musculature  versus impingement.  Patient requesting x-ray.  Discussed that I did not feel that this was medically necessary, and likely to be normal given lack of injury.  Patient still opted to have one performed.  X-ray negative.  Will treat with NSAIDs, recommend Tylenol and ibuprofen as, will avoid prednisone given patient breast-feeding.  Provided Flexeril 5 mg to use at bedtime.  Ice and no heavy  lifting.  Follow-up with Ortho if symptoms persisting.Discussed strict return precautions. Patient verbalized understanding and is agreeable with plan.  Final Clinical Impressions(s) / UC Diagnoses   Final diagnoses:  Strain of left shoulder, initial encounter     Discharge Instructions     Use anti-inflammatories for pain/swelling. You may take up to 600-800 mg Ibuprofen every 8 hours with food. You may supplement Ibuprofen with Tylenol [phone] every 8 hours.   You may use flexeril as needed to help with pain. This is a muscle relaxer and causes sedation- please use only at bedtime or when you will be home and not have to drive/work  Ice shoulder  Avoid heavy lifting with shoulder  Please follow up with orthopedics if symptoms persisting/worsening with treatment    ED Prescriptions    Medication Sig Dispense Auth. Provider   ibuprofen (ADVIL,MOTRIN) 600 MG tablet Take 1 tablet (600 mg total) by mouth every 8 (eight) hours as needed. 30 tablet Addis Tuohy C, PA-C   cyclobenzaprine (FLEXERIL) 5 MG tablet Take 1 tablet (5 mg total) by mouth 2 (two) times daily as needed for muscle spasms. 20 tablet Leondre Taul, [place] Center[place]ie C, PA-C     Controlled Substance Prescriptions [place] Controlled Substance Registry consulted? Not Applicable   Lew DawesWieters, Maham Quintin C, [place]C 04/22/18 1217

## 2018-08-08 ENCOUNTER — Encounter (HOSPITAL_COMMUNITY): Payer: Self-pay

## 2018-08-08 ENCOUNTER — Ambulatory Visit (INDEPENDENT_AMBULATORY_CARE_PROVIDER_SITE_OTHER): Payer: Self-pay

## 2018-08-08 ENCOUNTER — Ambulatory Visit (HOSPITAL_COMMUNITY)
Admission: EM | Admit: 2018-08-08 | Discharge: 2018-08-08 | Disposition: A | Discharge status: Home / Self Care | Payer: Self-pay | Attending: Family Medicine | Admitting: Family Medicine

## 2018-08-08 DIAGNOSIS — R0789 Other chest pain: Secondary | ICD-10-CM

## 2018-08-08 DIAGNOSIS — R0602 Shortness of breath: Secondary | ICD-10-CM

## 2018-08-08 MED ORDER — CYCLOBENZAPRINE HCL 5 MG PO TABS: 5.0000 mg | ORAL_TABLET | Freq: Two times a day (BID) | ORAL | 0 refills | Status: DC | PRN

## 2018-08-08 MED ORDER — IBUPROFEN 600 MG PO TABS: 600.0000 mg | ORAL_TABLET | Freq: Three times a day (TID) | ORAL | 0 refills | Status: DC | PRN

## 2018-08-08 NOTE — ED Provider Notes (Signed)
[facility]    CSN: [id number] Arrival date & time: 08/08/18  1818     History   Chief Complaint Chief Complaint  Patient presents with  . Chest Discomfort    HPI [name] is a 32 y.o. female no significant past medical history presenting today for evaluation of chest discomfort.  Patient states that over the past month she has had a sharp pain in her chest that comes and goes.  Pain is located centrally, but will occasionally have a burning sensation that is located in her left upper chest area.  She is also noted shortness of breath occasionally over this past month as well.  She has had occasional cough.  Sensation will last for a few seconds to minutes, does not feel this every day.  Does not have associated nausea or vomiting.  She has been eating and drinking like normal.  Denies recent illness, cold symptoms.  She has not taken any medicines for this.  She sometimes has associated left shoulder pain which she will take a Flexeril for occasionally.  Denies history of hypertension, DM, tobacco use.  Denies family history of MI at early age.  Denies previous DVT/PE.  Denies leg pain or leg swelling.  Denies recent travel, immobilization or surgeries. HPI  Past Medical History:  Diagnosis Date  . No pertinent past medical history     Patient Active Problem List   Diagnosis Date Noted  . Active labor 12/05/2015    Past Surgical History:  Procedure Laterality Date  . CESAREAN SECTION  10/19/2012   Procedure: CESAREAN SECTION;  Surgeon: Levi Aland, MD;  Location: [facility];  Service: Obstetrics;  Laterality: N/A;  . CESAREAN SECTION N/A 12/05/2015   Procedure: CESAREAN SECTION;  Surgeon: Philip Aspen, DO;  Location: [facility];  Service: Obstetrics;  Laterality: N/A;  . NO PAST SURGERIES      OB History    Gravida  2   Para  2   Term  2   Preterm      AB      Living  2     SAB      TAB      Ectopic      Multiple  0   Live Births  2             Home Medications    Prior to Admission medications   Medication Sig Start Date End Date Taking? Authorizing Provider  cyclobenzaprine (FLEXERIL) 5 MG tablet Take 1 tablet (5 mg total) by mouth 2 (two) times daily as needed for muscle spasms. 08/08/18   ,  C, PA-C  ibuprofen (ADVIL,MOTRIN) 600 MG tablet Take 1 tablet (600 mg total) by mouth every 8 (eight) hours as needed. 08/08/18   ,  C, PA-C  tetrahydrozoline 0.05 % ophthalmic solution Place 1 drop into both eyes daily as needed.    [provider]    Family History Family History  Problem Relation Age of Onset  . Other Neg Hx     Social History Social History   Tobacco Use  . Smoking status: Never Smoker  . Smokeless tobacco: Never Used  Substance Use Topics  . Alcohol use: No  . Drug use: No     Allergies   Patient has no known allergies.   Review of Systems Review of Systems  Constitutional: Negative for activity change, appetite change, chills, fatigue and fever.  HENT: Negative for congestion, ear pain, rhinorrhea, sinus pressure, sore  throat and trouble swallowing.   Eyes: Negative for discharge and redness.  Respiratory: Positive for cough and shortness of breath. Negative for chest tightness.   Cardiovascular: Positive for chest pain.  Gastrointestinal: Negative for abdominal pain, diarrhea, nausea and vomiting.  Musculoskeletal: Positive for myalgias.  Skin: Negative for rash.  Neurological: Negative for dizziness, light-headedness and headaches.     Physical Exam Triage Vital Signs ED Triage Vitals  Enc Vitals Group     BP 08/08/18 1913 (!) 147/97     Pulse Rate 08/08/18 1913 80     Resp 08/08/18 1913 20     Temp 08/08/18 1913 98 F (36.7 C)     Temp Source 08/08/18 1913 Oral     SpO2 08/08/18 1913 99 %     Weight --      Height --      Head Circumference --      Peak Flow --      Pain Score 08/08/18 1914 2     Pain Loc --      Pain Edu? --       Excl. in GC? --    No data found.  Updated Vital Signs BP (!) 147/97 (BP Location: Left Arm)   Pulse 80   Temp 98 F (36.7 C) (Oral)   Resp 20   LMP 08/01/2018   SpO2 99%   Visual Acuity Right Eye Distance:   Left Eye Distance:   Bilateral Distance:    Right Eye Near:   Left Eye Near:    Bilateral Near:     Physical Exam  Constitutional: She appears well-developed and well-nourished. No distress.  HENT:  Head: Normocephalic and atraumatic.  Oral mucosa pink and moist, no tonsillar enlargement or exudate. Posterior pharynx patent and nonerythematous, no uvula deviation or swelling. Normal phonation.  Eyes: Pupils are equal, round, and reactive to light. Conjunctivae and EOM are normal.  Neck: Neck supple.  Cardiovascular: Normal rate and regular rhythm.  No murmur heard. Pulmonary/Chest: Effort normal and breath sounds normal. No respiratory distress. She exhibits tenderness.  Breathing comfortably at rest, CTABL, no wheezing, rales or other adventitious sounds auscultated  Anterior chest tender to left superior aspect, nontender overlying right pectoral area  Abdominal: Soft. There is no tenderness.  Nontender light deep palpation throughout abdomen  Musculoskeletal: She exhibits no edema.  Neurological: She is alert.  Skin: Skin is warm and dry.  Psychiatric: She has a normal mood and affect.  Nursing note and vitals reviewed.    UC Treatments / Results  Labs (all labs ordered are listed, but only abnormal results are displayed) Labs Reviewed - No data to display  EKG None  Radiology Dg Chest 2 View  Result Date: 08/08/2018 CLINICAL DATA:  Chest pain, shortness of breath. EXAM: CHEST - 2 VIEW COMPARISON:  None. FINDINGS: The heart size and mediastinal contours are within normal limits. Both lungs are clear. No pneumothorax or pleural effusion is noted. The visualized skeletal structures are unremarkable. IMPRESSION: No active cardiopulmonary disease.  Electronically Signed   By: Lupita Raider, M.D.   On: 08/08/2018 19:46    Procedures Procedures (including critical care time)  Medications Ordered in UC Medications - No data to display  Initial Impression / Assessment and Plan / UC Course  I have reviewed the triage vital signs and the nursing notes.  Pertinent labs & imaging results that were available during my care of the patient were reviewed by me and considered in my  medical decision making (see chart for details).     Chest x-ray negative, EKG normal sinus rhythm with sinus arrhythmia, no acute signs of ischemia or infarction.  Negative risk factors for PE.  Chest discomfort reproducible on exam, will recommend to treat for musculoskeletal cause with anti-inflammatories.  Refilled Flexeril from previous visit as she has had relief with this as well.  Discussed to continue to monitor discomfort and shortness of breath and follow-up if symptoms worsening or changing.Discussed strict return precautions. Patient verbalized understanding and is agreeable with plan.  Final Clinical Impressions(s) / UC Diagnoses   Final diagnoses:  Atypical chest pain     Discharge Instructions     Chest x-ray normal EKG was normal as well  Use anti-inflammatories for pain/swelling. You may take up to 800 mg Ibuprofen every 8 hours with food. You may supplement Ibuprofen with Tylenol [phone] mg every 8 hours.   Please continue to monitor your symptoms and follow-up if symptoms change or worsening  Please go to emergency room if developing worsening chest discomfort, shortness of breath, difficulty breathing, leg pain or leg swelling    ED Prescriptions    Medication Sig Dispense Auth. Provider   ibuprofen (ADVIL,MOTRIN) 600 MG tablet Take 1 tablet (600 mg total) by mouth every 8 (eight) hours as needed. 30 tablet ,  C, PA-C   cyclobenzaprine (FLEXERIL) 5 MG tablet Take 1 tablet (5 mg total) by mouth 2 (two) times daily as  needed for muscle spasms. 20 tablet , [place] C, PA-C     Controlled Substance Prescriptions [place] Controlled Substance Registry consulted? Not Applicable   Lew Dawes,  C, [place]-C 08/08/18 2003

## 2018-08-08 NOTE — ED Triage Notes (Signed)
Pt presents with some chest discomfort that she says comes and goes and sometimes radiates around to her shoulder on left side; pt also states sometimes she is short of breath.

## 2018-08-08 NOTE — Discharge Instructions (Signed)
Chest x-ray normal EKG was normal as well  Use anti-inflammatories for pain/swelling. You may take up to 800 mg Ibuprofen every 8 hours with food. You may supplement Ibuprofen with Tylenol [phone] mg every 8 hours.   Please continue to monitor your symptoms and follow-up if symptoms change or worsening  Please go to emergency room if developing worsening chest discomfort, shortness of breath, difficulty breathing, leg pain or leg swelling

## 2018-09-13 ENCOUNTER — Encounter (HOSPITAL_COMMUNITY): Payer: Self-pay | Admitting: Emergency Medicine

## 2018-09-13 ENCOUNTER — Other Ambulatory Visit: Payer: Self-pay

## 2018-09-13 ENCOUNTER — Ambulatory Visit (HOSPITAL_COMMUNITY)
Admission: EM | Admit: 2018-09-13 | Discharge: 2018-09-13 | Disposition: A | Discharge status: Home / Self Care | Payer: Self-pay | Attending: Family Medicine | Admitting: Family Medicine

## 2018-09-13 DIAGNOSIS — J111 Influenza due to unidentified influenza virus with other respiratory manifestations: Secondary | ICD-10-CM | POA: Insufficient documentation

## 2018-09-13 DIAGNOSIS — Z79899 Other long term (current) drug therapy: Secondary | ICD-10-CM | POA: Insufficient documentation

## 2018-09-13 DIAGNOSIS — Z791 Long term (current) use of non-steroidal anti-inflammatories (NSAID): Secondary | ICD-10-CM | POA: Insufficient documentation

## 2018-09-13 LAB — POCT RAPID STREP A: Streptococcus, Group A Screen (Direct): NEGATIVE

## 2018-09-13 MED ORDER — OSELTAMIVIR PHOSPHATE 75 MG PO CAPS: 75.0000 mg | ORAL_CAPSULE | Freq: Every day | ORAL | 0 refills | Status: DC

## 2018-09-13 MED ORDER — HYDROCODONE-HOMATROPINE 5-1.5 MG/5ML PO SYRP: 5.0000 mL | ORAL_SOLUTION | Freq: Four times a day (QID) | ORAL | 0 refills | Status: DC | PRN

## 2018-09-13 NOTE — Discharge Instructions (Signed)
Your strep test is negative.  Instead you have the flu.  You are probably contagious for another couple days.  Your son will not get this because he is already had the medicine and is becoming immune.  Your husband could potentially get this and so I am calling in some Tamiflu.

## 2018-09-13 NOTE — ED Triage Notes (Signed)
PT reports headache, cough, congestion, sore throat, body aches. Started Sunday. PT's son was diagnosed with the flu Saturday.

## 2018-09-13 NOTE — ED Provider Notes (Signed)
[facility]    CSN: [id number] Arrival date & time: 09/13/18  4098     History   Chief Complaint Chief Complaint  Patient presents with  . Influenza    HPI [name] is a 32 y.o. female.   This is a 32 year old female established patient.  PT reports headache, cough, congestion, sore throat, body aches. Started Sunday. PT's son was diagnosed with the flu Saturday.      Past Medical History:  Diagnosis Date  . No pertinent past medical history     Patient Active Problem List   Diagnosis Date Noted  . Active labor 12/05/2015    Past Surgical History:  Procedure Laterality Date  . CESAREAN SECTION  10/19/2012   Procedure: CESAREAN SECTION;  Surgeon: Levi Aland, MD;  Location: [facility];  Service: Obstetrics;  Laterality: N/A;  . CESAREAN SECTION N/A 12/05/2015   Procedure: CESAREAN SECTION;  Surgeon: Philip Aspen, DO;  Location: [facility];  Service: Obstetrics;  Laterality: N/A;  . NO PAST SURGERIES      OB History    Gravida  2   Para  2   Term  2   Preterm      AB      Living  2     SAB      TAB      Ectopic      Multiple  0   Live Births  2            Home Medications    Prior to Admission medications   Medication Sig Start Date End Date Taking? Authorizing Provider  HYDROcodone-homatropine (HYDROMET) 5-1.5 MG/5ML syrup Take 5 mLs by mouth every 6 (six) hours as needed for cough. 09/13/18   Elvina Sidle, MD  ibuprofen (ADVIL,MOTRIN) 600 MG tablet Take 1 tablet (600 mg total) by mouth every 8 (eight) hours as needed. 08/08/18   Wieters, Hallie C, PA-C  oseltamivir (TAMIFLU) 75 MG capsule Take 1 capsule (75 mg total) by mouth daily. 09/13/18   Elvina Sidle, MD  tetrahydrozoline 0.05 % ophthalmic solution Place 1 drop into both eyes daily as needed.    [provider]    Family History Family History  Problem Relation Age of Onset  . Other Neg Hx     Social History Social History   Tobacco Use  .  Smoking status: Never Smoker  . Smokeless tobacco: Never Used  Substance Use Topics  . Alcohol use: No  . Drug use: No     Allergies   Patient has no known allergies.   Review of Systems Review of Systems   Physical Exam Triage Vital Signs ED Triage Vitals  Enc Vitals Group     BP 09/13/18 0843 (!) 124/92     Pulse Rate 09/13/18 0843 (!) 108     Resp 09/13/18 0843 16     Temp 09/13/18 0843 99.7 F (37.6 C)     Temp Source 09/13/18 0843 Oral     SpO2 --      Weight --      Height --      Head Circumference --      Peak Flow --      Pain Score 09/13/18 0845 6     Pain Loc --      Pain Edu? --      Excl. in GC? --    No data found.  Updated Vital Signs BP (!) 124/92 (BP Location: Right Arm)  Pulse (!) 108   Temp 99.7 F (37.6 C) (Oral)   Resp 16   LMP 09/07/2018    Physical Exam Vitals signs and nursing note reviewed.  HENT:     Head: Normocephalic.     Right Ear: Tympanic membrane and external ear normal.     Left Ear: External ear normal.     Nose: Nose normal.     Mouth/Throat:     Mouth: Mucous membranes are moist.     Pharynx: Posterior oropharyngeal erythema present.  Eyes:     Conjunctiva/sclera: Conjunctivae normal.  Cardiovascular:     Rate and Rhythm: Normal rate.     Heart sounds: Normal heart sounds.  Pulmonary:     Effort: Pulmonary effort is normal.     Breath sounds: Normal breath sounds.  Musculoskeletal: Normal range of motion.  Skin:    General: Skin is warm and dry.  Neurological:     General: No focal deficit present.     Mental Status: She is alert.  Psychiatric:        Mood and Affect: Mood normal.      UC Treatments / Results  Labs (all labs ordered are listed, but only abnormal results are displayed) Labs Reviewed  POCT RAPID STREP A    EKG None  Radiology No results found.  Procedures Procedures (including critical care time)  Medications Ordered in UC Medications - No data to display  Initial  Impression / Assessment and Plan / UC Course  I have reviewed the triage vital signs and the nursing notes.  Pertinent labs & imaging results that were available during my care of the patient were reviewed by me and considered in my medical decision making (see chart for details).    Final Clinical Impressions(s) / UC Diagnoses   Final diagnoses:  Influenza     Discharge Instructions     Your strep test is negative.  Instead you have the flu.  You are probably contagious for another couple days.  Your son will not get this because he is already had the medicine and is becoming immune.  Your husband could potentially get this and so I am calling in some Tamiflu.    ED Prescriptions    Medication Sig Dispense Auth. Provider   HYDROcodone-homatropine (HYDROMET) 5-1.5 MG/5ML syrup Take 5 mLs by mouth every 6 (six) hours as needed for cough. 60 mL Elvina SidleLauenstein, Nicey Krah, MD   oseltamivir (TAMIFLU) 75 MG capsule Take 1 capsule (75 mg total) by mouth daily. 10 capsule Elvina SidleLauenstein, Siren Porrata, MD     Controlled Substance Prescriptions [place] Controlled Substance Registry consulted? Not Applicable   Elvina SidleLauenstein, Adreana Coull, MD 09/13/18 [phone]

## 2018-09-16 LAB — CULTURE, GROUP A STREP (THRC)

## 2019-10-08 ENCOUNTER — Ambulatory Visit (HOSPITAL_COMMUNITY)
Admission: EM | Admit: 2019-10-08 | Discharge: 2019-10-08 | Disposition: A | Discharge status: Home / Self Care | Payer: Self-pay | Attending: Family Medicine | Admitting: Family Medicine

## 2019-10-08 ENCOUNTER — Other Ambulatory Visit: Payer: Self-pay

## 2019-10-08 ENCOUNTER — Encounter (HOSPITAL_COMMUNITY): Payer: Self-pay | Admitting: Emergency Medicine

## 2019-10-08 DIAGNOSIS — M545 Low back pain, unspecified: Secondary | ICD-10-CM

## 2019-10-08 LAB — POCT URINALYSIS DIP (DEVICE)
Bilirubin Urine: NEGATIVE
Glucose, UA: NEGATIVE mg/dL
Hgb urine dipstick: NEGATIVE
Ketones, ur: NEGATIVE mg/dL
Leukocytes,Ua: NEGATIVE
Nitrite: NEGATIVE
Protein, ur: NEGATIVE mg/dL
Specific Gravity, Urine: 1.025 (ref 1.005–1.030)
Urobilinogen, UA: 0.2 mg/dL (ref 0.0–1.0)
pH: 6 (ref 5.0–8.0)

## 2019-10-08 MED ORDER — IBUPROFEN 800 MG PO TABS: 800.0000 mg | ORAL_TABLET | Freq: Three times a day (TID) | ORAL | 0 refills | Status: DC

## 2019-10-08 MED ORDER — TIZANIDINE HCL 4 MG PO TABS: 4.0000 mg | ORAL_TABLET | Freq: Four times a day (QID) | ORAL | 0 refills | Status: DC | PRN

## 2019-10-08 NOTE — ED Provider Notes (Signed)
[facility]    CSN: [id number] Arrival date & time: 10/08/19  1745      History   Chief Complaint Chief Complaint  Patient presents with  . Back Pain    left    HPI [name] is a 34 y.o. female.   HPI  Patient is here for back pain.  She states it hurts in the left low back.  This is been going on for about a week.  The pain comes and goes.  The pain is in her flank region and down into the low back.  Some pain into the left lower quadrant.  No hematuria.  No urinary symptoms.  No nausea or vomiting or fever.  No history of kidney stones or kidney problems.  Patient is not pregnant.  She has not had any injury.  She has not done any heavy lifting.  She tried some ibuprofen but this did not help.  Past Medical History:  Diagnosis Date  . No pertinent past medical history     Patient Active Problem List   Diagnosis Date Noted  . Active labor 12/05/2015    Past Surgical History:  Procedure Laterality Date  . CESAREAN SECTION  10/19/2012   Procedure: CESAREAN SECTION;  Surgeon: Levi Aland, MD;  Location: [facility];  Service: Obstetrics;  Laterality: N/A;  . CESAREAN SECTION N/A 12/05/2015   Procedure: CESAREAN SECTION;  Surgeon: Philip Aspen, DO;  Location: [facility];  Service: Obstetrics;  Laterality: N/A;  . NO PAST SURGERIES      OB History    Gravida  2   Para  2   Term  2   Preterm      AB      Living  2     SAB      TAB      Ectopic      Multiple  0   Live Births  2            Home Medications    Prior to Admission medications   Medication Sig Start Date End Date Taking? Authorizing Provider  ibuprofen (ADVIL) 800 MG tablet Take 1 tablet (800 mg total) by mouth 3 (three) times daily. 10/08/19   Eustace Moore, MD  tetrahydrozoline 0.05 % ophthalmic solution Place 1 drop into both eyes daily as needed.    [provider]  tiZANidine (ZANAFLEX) 4 MG tablet Take 1-2 tablets (4-8 mg total) by mouth every 6 (six) hours  as needed for muscle spasms. 10/08/19   Eustace Moore, MD    Family History Family History  Problem Relation Age of Onset  . Hypertension Mother   . Healthy Father   . Other Neg Hx     Social History Social History   Tobacco Use  . Smoking status: Never Smoker  . Smokeless tobacco: Never Used  Substance Use Topics  . Alcohol use: No  . Drug use: No     Allergies   Patient has no known allergies.   Review of Systems Review of Systems  Constitutional: Negative for chills and fever.  Gastrointestinal: Negative for nausea and vomiting.  Genitourinary: Positive for flank pain. Negative for dysuria and frequency.  Musculoskeletal: Positive for back pain.     Physical Exam Triage Vital Signs ED Triage Vitals [10/08/19 1822]  Enc Vitals Group     BP (!) 143/83     Pulse Rate 96     Resp 14     Temp  98.6 F (37 C)     Temp Source Oral     SpO2 100 %     Weight      Height      Head Circumference      Peak Flow      Pain Score 8     Pain Loc      Pain Edu?      Excl. in GC?    No data found.  Updated Vital Signs BP (!) 143/83 (BP Location: Left Arm)   Pulse 96   Temp 98.6 F (37 C) (Oral)   Resp 14   LMP 09/17/2019 (Approximate)   SpO2 100%   Breastfeeding No      Physical Exam Constitutional:      General: She is not in acute distress.    Appearance: She is well-developed and normal weight. She is not ill-appearing.  HENT:     Head: Normocephalic and atraumatic.     Mouth/Throat:     Comments: Mask in place Eyes:     Conjunctiva/sclera: Conjunctivae normal.     Pupils: Pupils are equal, round, and reactive to light.  Cardiovascular:     Rate and Rhythm: Normal rate and regular rhythm.  Pulmonary:     Effort: Pulmonary effort is normal. No respiratory distress.     Breath sounds: Normal breath sounds.  Abdominal:     General: There is no distension.     Palpations: Abdomen is soft.     Tenderness: There is no abdominal tenderness.    Musculoskeletal:        General: Normal range of motion.     Cervical back: Normal range of motion and neck supple. No tenderness.     Comments: Mild tenderness in the left mid to lower lumbar muscles.  No muscle spasm.  Full but slow movement.  No CVA tenderness.  No abdominal tenderness.  Skin:    General: Skin is warm and dry.  Neurological:     Mental Status: She is alert.      UC Treatments / Results  Labs (all labs ordered are listed, but only abnormal results are displayed) Labs Reviewed  POCT URINALYSIS DIP (DEVICE)  Urinalysis is normal  EKG   Radiology No results found.  Procedures Procedures (including critical care time)  Medications Ordered in UC Medications - No data to display  Initial Impression / Assessment and Plan / UC Course  I have reviewed the triage vital signs and the nursing notes.  Pertinent labs & imaging results that were available during my care of the patient were reviewed by me and considered in my medical decision making (see chart for details).     With no history of kidney problems, and a normal urinalysis I doubt this is a kidney stone or infection.  We will treat her for musculoskeletal pain. Final Clinical Impressions(s) / UC Diagnoses   Final diagnoses:  Acute left-sided low back pain without sciatica     Discharge Instructions     No evidence of kidney disease Take the ibuprofen 3 x a day with food Take the tizanidine as needed muscle relaxer Heat to area Call or return if worse   ED Prescriptions    Medication Sig Dispense Auth. Provider   ibuprofen (ADVIL) 800 MG tablet Take 1 tablet (800 mg total) by mouth 3 (three) times daily. 21 tablet Eustace Moore, MD   tiZANidine (ZANAFLEX) 4 MG tablet Take 1-2 tablets (4-8 mg total) by mouth every 6 (  six) hours as needed for muscle spasms. 21 tablet Raylene Everts, MD     PDMP not reviewed this encounter.   Raylene Everts, MD 10/08/19 [id number]

## 2019-10-08 NOTE — ED Triage Notes (Signed)
Pt here today for left lower back and flank pain that started about a week ago.  She states the pain has been radiating to her left groin, into her hip and into her upper left leg.  Pt denies any urinary issues and states this happened one other time about a month ago in her mid back.

## 2019-10-08 NOTE — Discharge Instructions (Signed)
No evidence of kidney disease Take the ibuprofen 3 x a day with food Take the tizanidine as needed muscle relaxer Heat to area Call or return if worse

## 2020-06-03 IMAGING — DX DG CHEST 2V
2 series · 2 of 2 positions shown · non-contrast
Comparison: None.

CLINICAL DATA: Chest pain, shortness of breath.

EXAM:
CHEST - 2 VIEW

[chest pa]
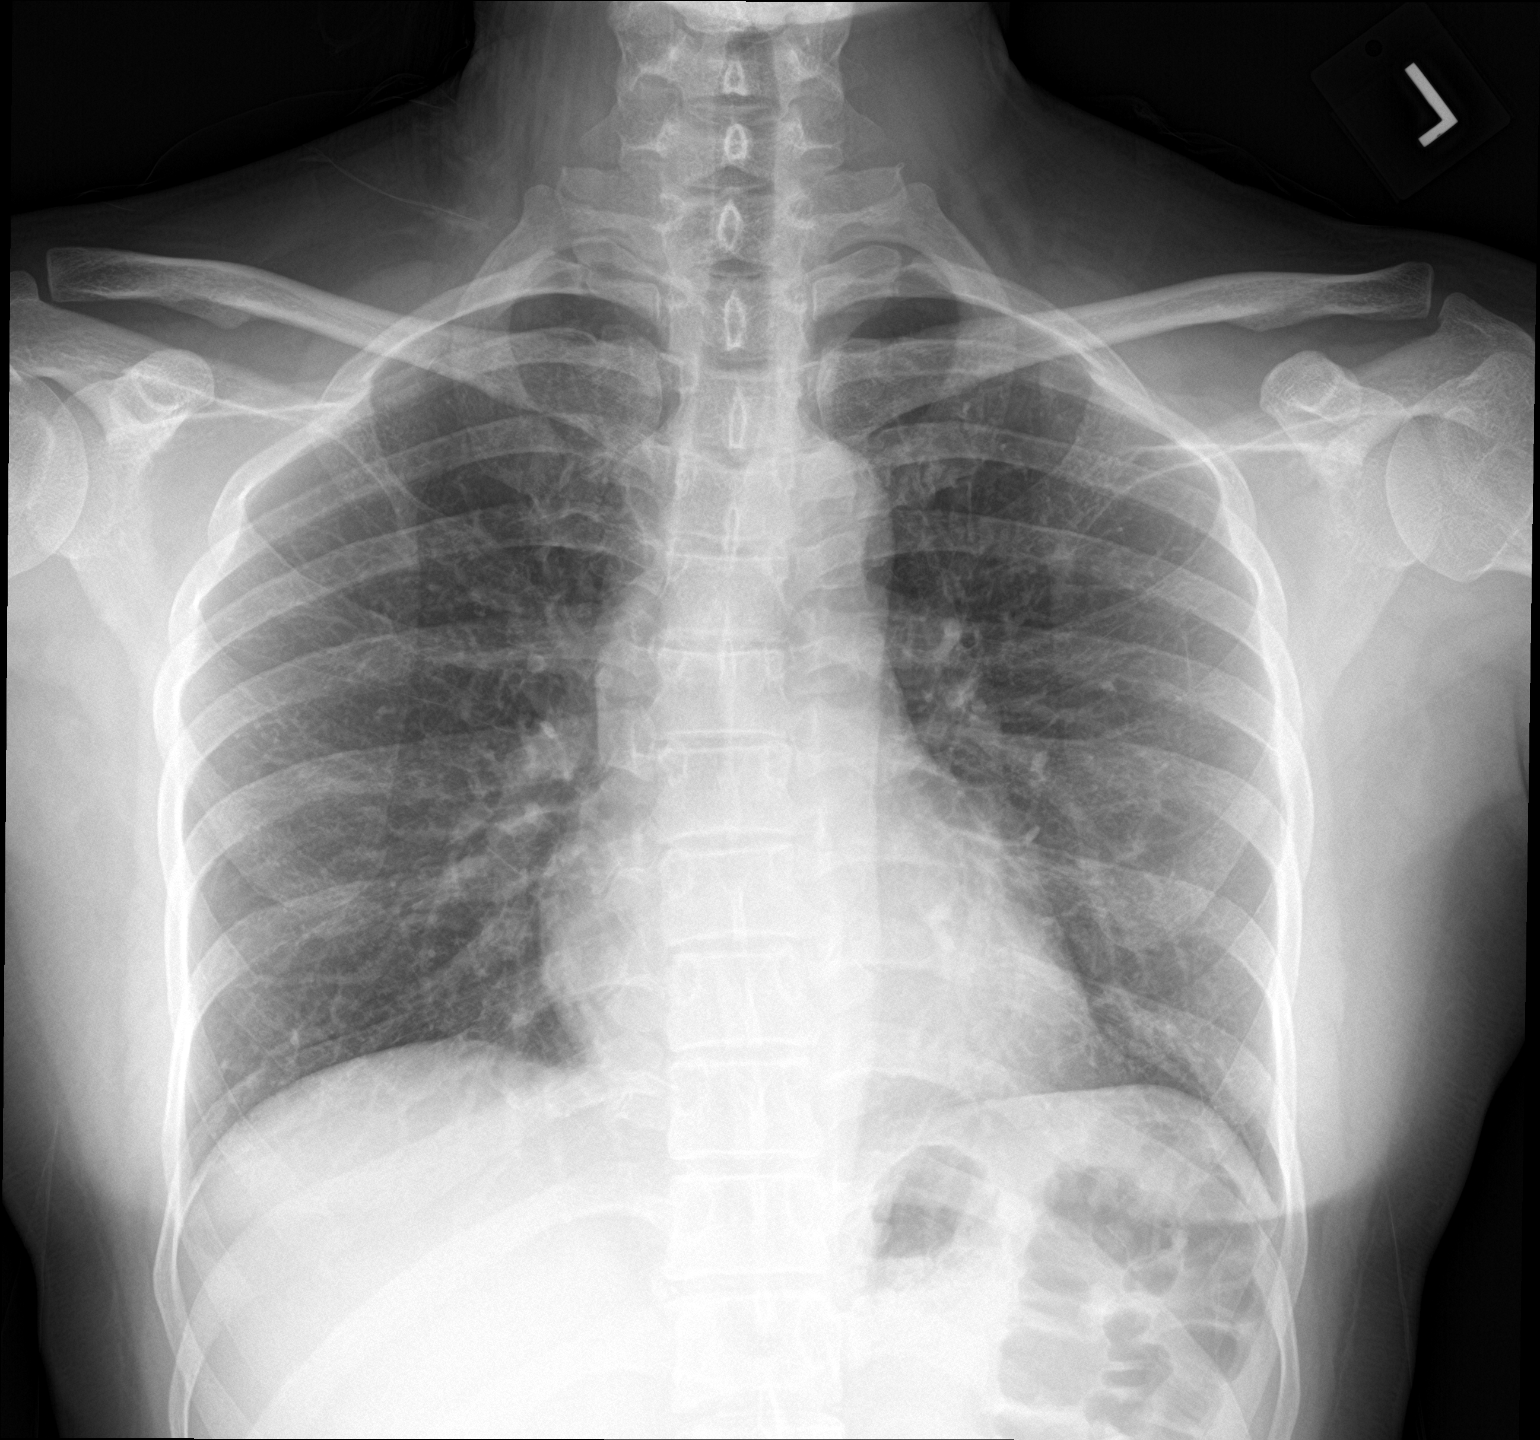

[chest lat]
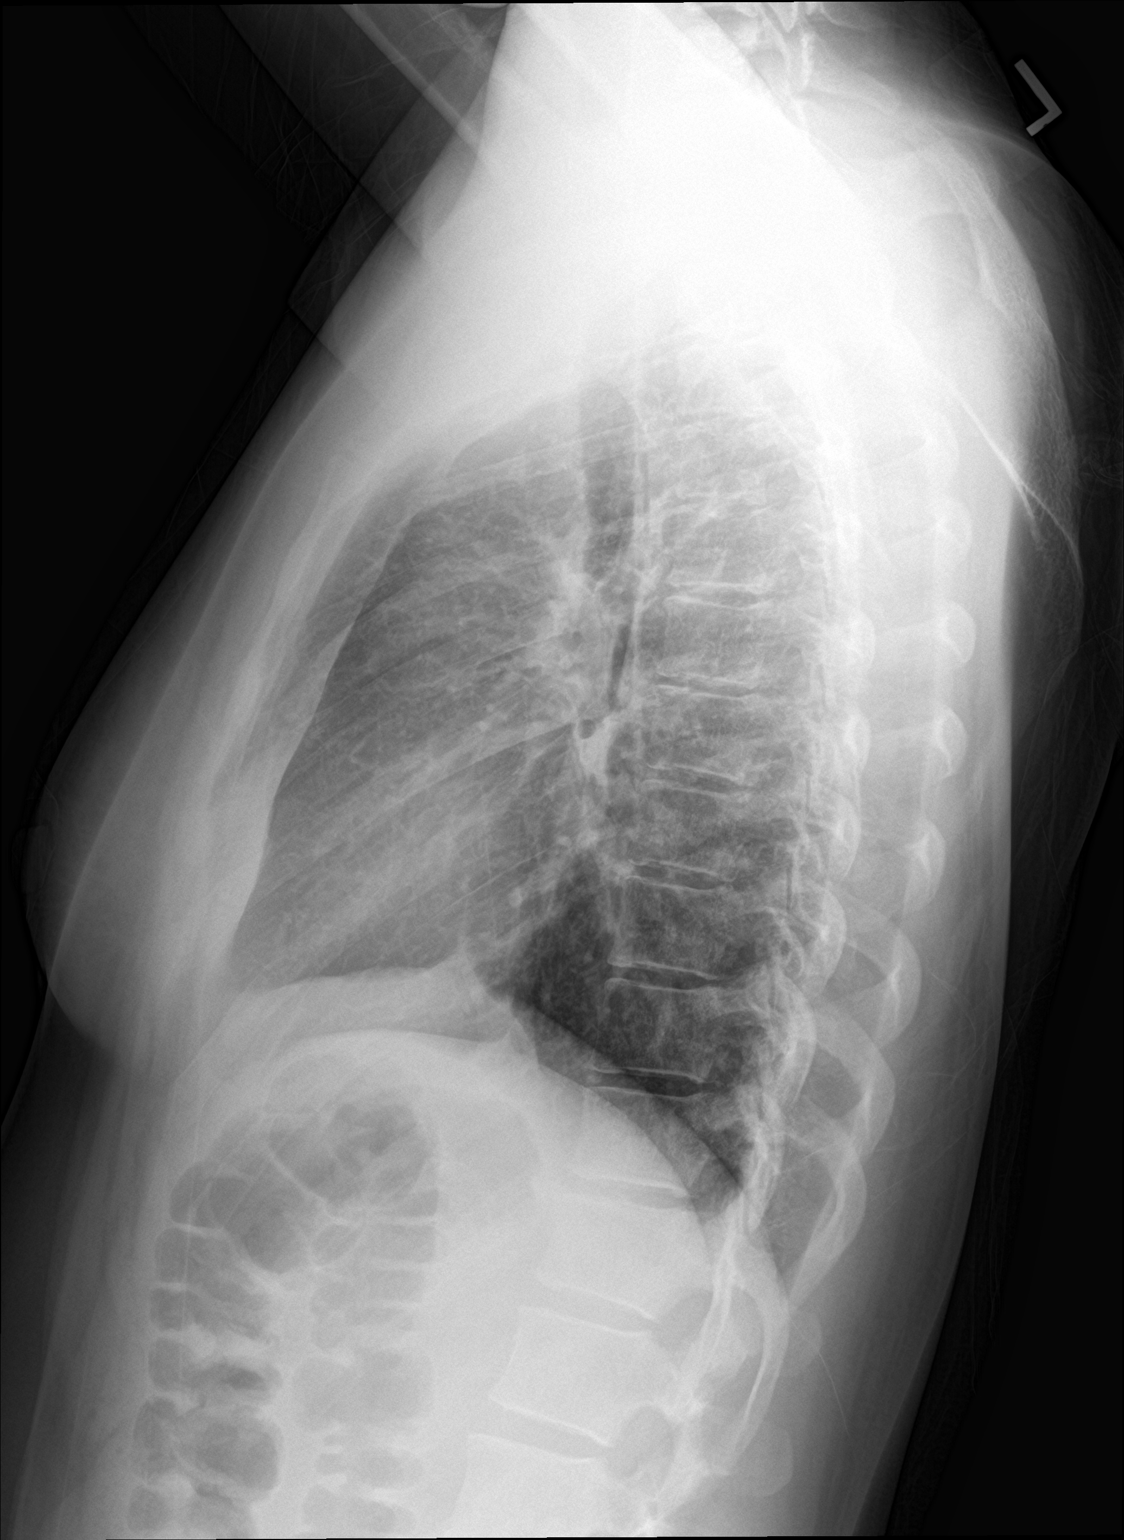

[2 of 2 positions shown; findings below may reference images not displayed]

FINDINGS: The heart size and mediastinal contours are within normal limits.
Both lungs are clear. No pneumothorax or pleural effusion is noted.
The visualized skeletal structures are unremarkable.
IMPRESSION: No active cardiopulmonary disease.

## 2021-02-18 ENCOUNTER — Other Ambulatory Visit: Payer: Self-pay

## 2021-02-18 ENCOUNTER — Ambulatory Visit (HOSPITAL_COMMUNITY)
Admission: EM | Admit: 2021-02-18 | Discharge: 2021-02-18 | Disposition: A | Discharge status: Home / Self Care | Payer: Self-pay | Attending: Physician Assistant | Admitting: Physician Assistant

## 2021-02-18 ENCOUNTER — Encounter (HOSPITAL_COMMUNITY): Payer: Self-pay

## 2021-02-18 DIAGNOSIS — B079 Viral wart, unspecified: Secondary | ICD-10-CM

## 2021-02-18 MED ORDER — SALICYLIC ACID 28 % EX SOLN: 1.0000 "application " | Freq: Every day | CUTANEOUS | 0 refills | Status: DC

## 2021-02-18 NOTE — ED Provider Notes (Signed)
[facility]    CSN: [id number] Arrival date & time: 02/18/21  1051      History   Chief Complaint Chief Complaint  Patient presents with  . Warts    HPI [name] is a 35 y.o. female.   Patient presents today with a 1 week history of multiple skin lesions on bilateral hands.  She denies episodes of similar symptoms in the past.  Denies any family with similar symptoms.  She has not tried any over-the-counter medications for symptom management.  Denies any bleeding or drainage.  Denies any additional symptoms including fever, nausea, vomiting, headache, dizziness.  She has not seen a dermatologist in the past and denies history of dermatological condition.     Past Medical History:  Diagnosis Date  . No pertinent past medical history     Patient Active Problem List   Diagnosis Date Noted  . Active labor 12/05/2015    Past Surgical History:  Procedure Laterality Date  . CESAREAN SECTION  10/19/2012   Procedure: CESAREAN SECTION;  Surgeon: Levi Aland, MD;  Location: [facility];  Service: Obstetrics;  Laterality: N/A;  . CESAREAN SECTION N/A 12/05/2015   Procedure: CESAREAN SECTION;  Surgeon: Philip Aspen, DO;  Location: [facility];  Service: Obstetrics;  Laterality: N/A;  . NO PAST SURGERIES      OB History    Gravida  2   Para  2   Term  2   Preterm      AB      Living  2     SAB      IAB      Ectopic      Multiple  0   Live Births  2            Home Medications    Prior to Admission medications   Medication Sig Start Date End Date Taking? Authorizing Provider  Salicylic Acid 28 % SOLN Apply 1 application topically daily. 02/18/21  Yes Cleveland Yarbro K, PA-C  ibuprofen (ADVIL) 800 MG tablet Take 1 tablet (800 mg total) by mouth 3 (three) times daily. 10/08/19   Eustace Moore, MD  tetrahydrozoline 0.05 % ophthalmic solution Place 1 drop into both eyes daily as needed.    [provider]  tiZANidine (ZANAFLEX) 4 MG tablet  Take 1-2 tablets (4-8 mg total) by mouth every 6 (six) hours as needed for muscle spasms. 10/08/19   Eustace Moore, MD    Family History Family History  Problem Relation Age of Onset  . Hypertension Mother   . Healthy Father   . Other Neg Hx     Social History Social History   Tobacco Use  . Smoking status: Never Smoker  . Smokeless tobacco: Never Used  Substance Use Topics  . Alcohol use: No  . Drug use: No     Allergies   Patient has no known allergies.   Review of Systems Review of Systems  Constitutional: Negative for activity change, appetite change, fatigue and fever.  Respiratory: Negative for cough and shortness of breath.   Cardiovascular: Negative for chest pain.  Gastrointestinal: Negative for abdominal pain, diarrhea, nausea and vomiting.  Skin: Positive for wound. Negative for color change and rash.  Neurological: Negative for dizziness, light-headedness and headaches.     Physical Exam Triage Vital Signs ED Triage Vitals  Enc Vitals Group     BP 02/18/21 1131 (!) 146/85     Pulse Rate 02/18/21 1131 77  Resp 02/18/21 1131 18     Temp 02/18/21 1131 98.5 F (36.9 C)     Temp Source 02/18/21 1131 Oral     SpO2 02/18/21 1131 98 %     Weight --      Height --      Head Circumference --      Peak Flow --      Pain Score 02/18/21 1129 0     Pain Loc --      Pain Edu? --      Excl. in GC? --    No data found.  Updated Vital Signs BP (!) 146/85 (BP Location: Left Arm)   Pulse 77   Temp 98.5 F (36.9 C) (Oral)   Resp 18   LMP 02/03/2021 (Exact Date)   SpO2 98%   Visual Acuity Right Eye Distance:   Left Eye Distance:   Bilateral Distance:    Right Eye Near:   Left Eye Near:    Bilateral Near:     Physical Exam Vitals reviewed.  Constitutional:      General: She is awake. She is not in acute distress.    Appearance: Normal appearance. She is normal weight. She is not ill-appearing.     Comments: Very pleasant female appears  stated age in no acute distress  HENT:     Head: Normocephalic and atraumatic.  Cardiovascular:     Rate and Rhythm: Normal rate and regular rhythm.     Heart sounds: No murmur heard.   Pulmonary:     Effort: Pulmonary effort is normal.     Breath sounds: Normal breath sounds. No wheezing, rhonchi or rales.     Comments: Clear to auscultation bilaterally Skin:    Findings: Lesion present.     Comments: Multiple flesh colored papules with hyperkeratotic surface noted right thumb, right ring finger, left middle finger.  No bleeding or drainage noted.  Psychiatric:        Behavior: Behavior is cooperative.      UC Treatments / Results  Labs (all labs ordered are listed, but only abnormal results are displayed) Labs Reviewed - No data to display  EKG   Radiology No results found.  Procedures Procedures (including critical care time)  Medications Ordered in UC Medications - No data to display  Initial Impression / Assessment and Plan / UC Course  I have reviewed the triage vital signs and the nursing notes.  Pertinent labs & imaging results that were available during my care of the patient were reviewed by me and considered in my medical decision making (see chart for details).     Discussed that warts can take a while to get better but often require multiple treatment modalities.  We will start salicylic acid but discussed potential utility of following up with dermatology to consider cryotherapy which is not offered in urgent care.  Discussed that salicylic acid can lead to irritation of skin and if she has any significant symptoms she is to stop medication and return for reevaluation.  Discussed that symptoms persist she needs to go to dermatology and was given contact information for dermatologist to schedule an appointment if symptoms persist.  Strict return precautions given to which patient expressed understanding.  Final Clinical Impressions(s) / UC Diagnoses    Final diagnoses:  Verruca warts (infectious)     Discharge Instructions     Apply salicylic acid daily to affected areas.  If symptoms do not improve within several weeks I would recommend  following up with dermatologist to consider cryotherapy or other treatment methods.  If anything worsens please return for reevaluation.    ED Prescriptions    Medication Sig Dispense Auth. Provider   Salicylic Acid 28 % SOLN Apply 1 application topically daily. 10 g Amahia Madonia, Noberto Retort, PA-C     PDMP not reviewed this encounter.   Jeani Hawking, PA-C 02/18/21 1153

## 2021-02-18 NOTE — ED Triage Notes (Signed)
Pt in with c/o warts on her right thumb that she noticed over 1 weeks ago  No at home treatment attempted

## 2021-02-18 NOTE — Discharge Instructions (Signed)
Apply salicylic acid daily to affected areas.  If symptoms do not improve within several weeks I would recommend following up with dermatologist to consider cryotherapy or other treatment methods.  If anything worsens please return for reevaluation.

## 2022-04-12 DIAGNOSIS — O26841 Uterine size-date discrepancy, first trimester: Secondary | ICD-10-CM | POA: Diagnosis not present

## 2022-04-12 DIAGNOSIS — Z3A13 13 weeks gestation of pregnancy: Secondary | ICD-10-CM | POA: Diagnosis not present

## 2022-04-12 DIAGNOSIS — Z3201 Encounter for pregnancy test, result positive: Secondary | ICD-10-CM | POA: Diagnosis not present

## 2022-04-21 DIAGNOSIS — Z3482 Encounter for supervision of other normal pregnancy, second trimester: Secondary | ICD-10-CM | POA: Diagnosis not present

## 2022-05-10 DIAGNOSIS — Z3482 Encounter for supervision of other normal pregnancy, second trimester: Secondary | ICD-10-CM | POA: Diagnosis not present

## 2022-06-02 DIAGNOSIS — Z363 Encounter for antenatal screening for malformations: Secondary | ICD-10-CM | POA: Diagnosis not present

## 2022-06-02 DIAGNOSIS — Z3A21 21 weeks gestation of pregnancy: Secondary | ICD-10-CM | POA: Diagnosis not present

## 2022-07-15 DIAGNOSIS — Z23 Encounter for immunization: Secondary | ICD-10-CM | POA: Diagnosis not present

## 2022-07-21 DIAGNOSIS — O99810 Abnormal glucose complicating pregnancy: Secondary | ICD-10-CM | POA: Diagnosis not present

## 2022-09-15 DIAGNOSIS — Z3480 Encounter for supervision of other normal pregnancy, unspecified trimester: Secondary | ICD-10-CM | POA: Diagnosis not present

## 2022-09-15 DIAGNOSIS — O09513 Supervision of elderly primigravida, third trimester: Secondary | ICD-10-CM | POA: Diagnosis not present

## 2022-09-15 DIAGNOSIS — Z3A36 36 weeks gestation of pregnancy: Secondary | ICD-10-CM | POA: Diagnosis not present

## 2022-09-21 ENCOUNTER — Inpatient Hospital Stay (HOSPITAL_COMMUNITY): Payer: Medicaid Other | Admitting: Anesthesiology

## 2022-09-21 ENCOUNTER — Encounter (HOSPITAL_COMMUNITY): Payer: Self-pay | Admitting: *Deleted

## 2022-09-21 ENCOUNTER — Other Ambulatory Visit: Payer: Self-pay

## 2022-09-21 ENCOUNTER — Encounter (HOSPITAL_COMMUNITY)
Admission: AD | Disposition: A | Discharge status: Home / Self Care | Payer: Self-pay | Source: Home / Self Care | Attending: Obstetrics and Gynecology

## 2022-09-21 ENCOUNTER — Inpatient Hospital Stay (HOSPITAL_COMMUNITY)
Admission: AD | Admit: 2022-09-21 | Discharge: 2022-09-24 | DRG: 788 | Disposition: A | Discharge status: Home / Self Care | Payer: Medicaid Other | Attending: Obstetrics and Gynecology | Admitting: Obstetrics and Gynecology

## 2022-09-21 DIAGNOSIS — O134 Gestational [pregnancy-induced] hypertension without significant proteinuria, complicating childbirth: Secondary | ICD-10-CM | POA: Diagnosis not present

## 2022-09-21 DIAGNOSIS — O133 Gestational [pregnancy-induced] hypertension without significant proteinuria, third trimester: Secondary | ICD-10-CM | POA: Diagnosis not present

## 2022-09-21 DIAGNOSIS — O09523 Supervision of elderly multigravida, third trimester: Secondary | ICD-10-CM | POA: Diagnosis not present

## 2022-09-21 DIAGNOSIS — O99892 Other specified diseases and conditions complicating childbirth: Secondary | ICD-10-CM | POA: Diagnosis not present

## 2022-09-21 DIAGNOSIS — Z3A37 37 weeks gestation of pregnancy: Secondary | ICD-10-CM

## 2022-09-21 DIAGNOSIS — O34211 Maternal care for low transverse scar from previous cesarean delivery: Secondary | ICD-10-CM | POA: Diagnosis not present

## 2022-09-21 DIAGNOSIS — O34219 Maternal care for unspecified type scar from previous cesarean delivery: Secondary | ICD-10-CM | POA: Diagnosis not present

## 2022-09-21 DIAGNOSIS — N736 Female pelvic peritoneal adhesions (postinfective): Secondary | ICD-10-CM | POA: Diagnosis not present

## 2022-09-21 DIAGNOSIS — Z3A00 Weeks of gestation of pregnancy not specified: Secondary | ICD-10-CM | POA: Diagnosis not present

## 2022-09-21 DIAGNOSIS — Z3490 Encounter for supervision of normal pregnancy, unspecified, unspecified trimester: Secondary | ICD-10-CM

## 2022-09-21 LAB — COMPREHENSIVE METABOLIC PANEL
ALT: 27 U/L (ref 0–44)
AST: 24 U/L (ref 15–41)
Albumin: 2.8 g/dL — ABNORMAL LOW (ref 3.5–5.0)
Alkaline Phosphatase: 57 U/L (ref 38–126)
Anion gap: 8 (ref 5–15)
BUN: 9 mg/dL (ref 6–20)
CO2: 21 mmol/L — ABNORMAL LOW (ref 22–32)
Calcium: 8.6 mg/dL — ABNORMAL LOW (ref 8.9–10.3)
Chloride: 109 mmol/L (ref 98–111)
Creatinine, Ser: 0.63 mg/dL (ref 0.44–1.00)
GFR, Estimated: >60 mL/min (ref >60)
Glucose, Bld: 79 mg/dL (ref 70–99)
Potassium: 3.5 mmol/L (ref 3.5–5.1)
Sodium: 138 mmol/L (ref 135–145)
Total Bilirubin: 0.4 mg/dL (ref 0.3–1.2)
Total Protein: 5.9 g/dL — ABNORMAL LOW (ref 6.5–8.1)

## 2022-09-21 LAB — TYPE AND SCREEN
ABO/RH(D): B POS
Antibody Screen: NEGATIVE

## 2022-09-21 LAB — CBC
HCT: 40.4 % (ref 36.0–46.0)
Hemoglobin: 13.4 g/dL (ref 12.0–15.0)
MCH: 26.7 pg (ref 26.0–34.0)
MCHC: 33.2 g/dL (ref 30.0–36.0)
MCV: 80.5 fL (ref 80.0–100.0)
Platelets: 188 10*3/uL (ref 150–400)
RBC: 5.02 MIL/uL (ref 3.87–5.11)
RDW: 14.6 % (ref 11.5–15.5)
WBC: 6 10*3/uL (ref 4.0–10.5)
nRBC: 0 % (ref 0.0–0.2)

## 2022-09-21 LAB — URINALYSIS, ROUTINE W REFLEX MICROSCOPIC
Bilirubin Urine: NEGATIVE
Glucose, UA: NEGATIVE mg/dL
Hgb urine dipstick: NEGATIVE
Ketones, ur: 5 mg/dL — AB
Leukocytes,Ua: NEGATIVE
Nitrite: NEGATIVE
Protein, ur: NEGATIVE mg/dL
Specific Gravity, Urine: 1.02 (ref 1.005–1.030)
pH: 6 (ref 5.0–8.0)

## 2022-09-21 LAB — PROTEIN / CREATININE RATIO, URINE
Creatinine, Urine: 117 mg/dL
Protein Creatinine Ratio: 0.11 mg/mg{Cre} (ref 0.00–0.15)
Total Protein, Urine: 13 mg/dL

## 2022-09-21 SURGERY — Surgical Case
Anesthesia: Spinal

## 2022-09-21 MED ORDER — SOD CITRATE-CITRIC ACID 500-334 MG/5ML PO SOLN
30.0000 mL | ORAL | Status: AC
  Administered 2022-09-21: 30 mL via ORAL
  Filled 2022-09-21: qty 30

## 2022-09-21 MED ORDER — PHENYLEPHRINE HCL-NACL 20-0.9 MG/250ML-% IV SOLN
INTRAVENOUS | Status: DC | PRN
  Administered 2022-09-21: 60 ug/min via INTRAVENOUS

## 2022-09-21 MED ORDER — DIPHENHYDRAMINE HCL 25 MG PO CAPS: 25.0000 mg | ORAL_CAPSULE | ORAL | Status: DC | PRN

## 2022-09-21 MED ORDER — ACETAMINOPHEN 500 MG PO TABS
1000.0000 mg | ORAL_TABLET | Freq: Four times a day (QID) | ORAL | Status: DC
  Administered 2022-09-22 – 2022-09-24 (×10): 1000 mg via ORAL
  Filled 2022-09-21 (×10): qty 2

## 2022-09-21 MED ORDER — FENTANYL CITRATE (PF) 100 MCG/2ML IJ SOLN
INTRAMUSCULAR | Status: DC | PRN
  Administered 2022-09-21: 15 ug via INTRATHECAL

## 2022-09-21 MED ORDER — KETOROLAC TROMETHAMINE 30 MG/ML IJ SOLN
INTRAMUSCULAR | Status: AC
  Filled 2022-09-21: qty 1

## 2022-09-21 MED ORDER — SCOPOLAMINE 1 MG/3DAYS TD PT72
MEDICATED_PATCH | TRANSDERMAL | Status: AC
  Filled 2022-09-21: qty 1

## 2022-09-21 MED ORDER — OXYCODONE HCL 5 MG PO TABS
5.0000 mg | ORAL_TABLET | ORAL | Status: DC | PRN
  Administered 2022-09-22: 5 mg via ORAL
  Filled 2022-09-21: qty 1

## 2022-09-21 MED ORDER — MORPHINE SULFATE (PF) 0.5 MG/ML IJ SOLN
INTRAMUSCULAR | Status: DC | PRN
  Administered 2022-09-21: 150 ug via INTRATHECAL

## 2022-09-21 MED ORDER — OXYTOCIN-SODIUM CHLORIDE 30-0.9 UT/500ML-% IV SOLN
INTRAVENOUS | Status: AC
  Filled 2022-09-21: qty 500

## 2022-09-21 MED ORDER — MEPERIDINE HCL 25 MG/ML IJ SOLN: 6.2500 mg | INTRAMUSCULAR | Status: DC | PRN

## 2022-09-21 MED ORDER — BUPIVACAINE IN DEXTROSE 0.75-8.25 % IT SOLN
INTRATHECAL | Status: DC | PRN
  Administered 2022-09-21: 1.6 mL via INTRATHECAL

## 2022-09-21 MED ORDER — FENTANYL CITRATE (PF) 100 MCG/2ML IJ SOLN
INTRAMUSCULAR | Status: AC
  Filled 2022-09-21: qty 2

## 2022-09-21 MED ORDER — LACTATED RINGERS IV SOLN: INTRAVENOUS | Status: DC | PRN

## 2022-09-21 MED ORDER — KETOROLAC TROMETHAMINE 30 MG/ML IJ SOLN
30.0000 mg | Freq: Once | INTRAMUSCULAR | Status: AC | PRN
  Administered 2022-09-21: 30 mg via INTRAVENOUS

## 2022-09-21 MED ORDER — TRANEXAMIC ACID-NACL 1000-0.7 MG/100ML-% IV SOLN
1000.0000 mg | Freq: Once | INTRAVENOUS | Status: AC
  Administered 2022-09-21: 1000 mg via INTRAVENOUS

## 2022-09-21 MED ORDER — MORPHINE SULFATE (PF) 0.5 MG/ML IJ SOLN
INTRAMUSCULAR | Status: AC
  Filled 2022-09-21: qty 10

## 2022-09-21 MED ORDER — LACTATED RINGERS IV BOLUS
1000.0000 mL | Freq: Once | INTRAVENOUS | Status: AC
  Administered 2022-09-21: 1000 mL via INTRAVENOUS

## 2022-09-21 MED ORDER — CEFAZOLIN SODIUM-DEXTROSE 2-4 GM/100ML-% IV SOLN
INTRAVENOUS | Status: AC
  Filled 2022-09-21: qty 100

## 2022-09-21 MED ORDER — ONDANSETRON HCL 4 MG/2ML IJ SOLN
INTRAMUSCULAR | Status: DC | PRN
  Administered 2022-09-21: 4 mg via INTRAVENOUS

## 2022-09-21 MED ORDER — OXYTOCIN-SODIUM CHLORIDE 30-0.9 UT/500ML-% IV SOLN
INTRAVENOUS | Status: DC | PRN
  Administered 2022-09-21: 400 [IU] via INTRAVENOUS

## 2022-09-21 MED ORDER — STERILE WATER FOR IRRIGATION IR SOLN
Status: DC | PRN
  Administered 2022-09-21: 1

## 2022-09-21 MED ORDER — COCONUT OIL OIL: 1.0000 | TOPICAL_OIL | Status: DC | PRN

## 2022-09-21 MED ORDER — SCOPOLAMINE 1 MG/3DAYS TD PT72
1.0000 | MEDICATED_PATCH | Freq: Once | TRANSDERMAL | Status: DC
  Administered 2022-09-21: 1.5 mg via TRANSDERMAL

## 2022-09-21 MED ORDER — FAMOTIDINE IN NACL 20-0.9 MG/50ML-% IV SOLN
20.0000 mg | Freq: Once | INTRAVENOUS | Status: AC
  Administered 2022-09-21: 20 mg via INTRAVENOUS
  Filled 2022-09-21: qty 50

## 2022-09-21 MED ORDER — WITCH HAZEL-GLYCERIN EX PADS: 1.0000 | MEDICATED_PAD | CUTANEOUS | Status: DC | PRN

## 2022-09-21 MED ORDER — LACTATED RINGERS IV SOLN: INTRAVENOUS | Status: DC

## 2022-09-21 MED ORDER — MENTHOL 3 MG MT LOZG: 1.0000 | LOZENGE | OROMUCOSAL | Status: DC | PRN

## 2022-09-21 MED ORDER — IBUPROFEN 600 MG PO TABS
600.0000 mg | ORAL_TABLET | Freq: Four times a day (QID) | ORAL | Status: DC
  Administered 2022-09-22 – 2022-09-24 (×10): 600 mg via ORAL
  Filled 2022-09-21 (×10): qty 1

## 2022-09-21 MED ORDER — SODIUM CHLORIDE 0.9 % IR SOLN
Status: DC | PRN
  Administered 2022-09-21: 1

## 2022-09-21 MED ORDER — DIPHENHYDRAMINE HCL 25 MG PO CAPS: 25.0000 mg | ORAL_CAPSULE | Freq: Four times a day (QID) | ORAL | Status: DC | PRN

## 2022-09-21 MED ORDER — SENNOSIDES-DOCUSATE SODIUM 8.6-50 MG PO TABS
2.0000 | ORAL_TABLET | Freq: Every day | ORAL | Status: DC
  Administered 2022-09-22 – 2022-09-24 (×3): 2 via ORAL
  Filled 2022-09-21 (×3): qty 2

## 2022-09-21 MED ORDER — DIPHENHYDRAMINE HCL 50 MG/ML IJ SOLN: 12.5000 mg | INTRAMUSCULAR | Status: DC | PRN

## 2022-09-21 MED ORDER — DEXAMETHASONE SODIUM PHOSPHATE 4 MG/ML IJ SOLN
INTRAMUSCULAR | Status: AC
  Filled 2022-09-21: qty 1

## 2022-09-21 MED ORDER — PHENYLEPHRINE HCL-NACL 20-0.9 MG/250ML-% IV SOLN
INTRAVENOUS | Status: AC
  Filled 2022-09-21: qty 250

## 2022-09-21 MED ORDER — DIBUCAINE (PERIANAL) 1 % EX OINT: 1.0000 | TOPICAL_OINTMENT | CUTANEOUS | Status: DC | PRN

## 2022-09-21 MED ORDER — NALOXONE HCL 4 MG/10ML IJ SOLN: 1.0000 ug/kg/h | INTRAVENOUS | Status: DC | PRN

## 2022-09-21 MED ORDER — ZOLPIDEM TARTRATE 5 MG PO TABS: 5.0000 mg | ORAL_TABLET | Freq: Every evening | ORAL | Status: DC | PRN

## 2022-09-21 MED ORDER — ONDANSETRON HCL 4 MG/2ML IJ SOLN
INTRAMUSCULAR | Status: AC
  Filled 2022-09-21: qty 2

## 2022-09-21 MED ORDER — SODIUM CHLORIDE 0.9% FLUSH: 3.0000 mL | INTRAVENOUS | Status: DC | PRN

## 2022-09-21 MED ORDER — NALOXONE HCL 0.4 MG/ML IJ SOLN: 0.4000 mg | INTRAMUSCULAR | Status: DC | PRN

## 2022-09-21 MED ORDER — ONDANSETRON HCL 4 MG/2ML IJ SOLN
4.0000 mg | Freq: Three times a day (TID) | INTRAMUSCULAR | Status: DC | PRN
  Administered 2022-09-21: 4 mg via INTRAVENOUS
  Filled 2022-09-21: qty 2

## 2022-09-21 MED ORDER — SIMETHICONE 80 MG PO CHEW
80.0000 mg | CHEWABLE_TABLET | Freq: Three times a day (TID) | ORAL | Status: DC
  Administered 2022-09-22 – 2022-09-24 (×6): 80 mg via ORAL
  Filled 2022-09-21 (×6): qty 1

## 2022-09-21 MED ORDER — TRANEXAMIC ACID-NACL 1000-0.7 MG/100ML-% IV SOLN
INTRAVENOUS | Status: AC
  Filled 2022-09-21: qty 100

## 2022-09-21 MED ORDER — CEFAZOLIN SODIUM-DEXTROSE 2-4 GM/100ML-% IV SOLN
2.0000 g | INTRAVENOUS | Status: AC
  Administered 2022-09-21: 2 g via INTRAVENOUS
  Filled 2022-09-21: qty 100

## 2022-09-21 MED ORDER — PRENATAL MULTIVITAMIN CH
1.0000 | ORAL_TABLET | Freq: Every day | ORAL | Status: DC
  Administered 2022-09-22 – 2022-09-24 (×3): 1 via ORAL
  Filled 2022-09-21 (×3): qty 1

## 2022-09-21 MED ORDER — OXYTOCIN-SODIUM CHLORIDE 30-0.9 UT/500ML-% IV SOLN
2.5000 [IU]/h | INTRAVENOUS | Status: AC
  Administered 2022-09-21: 2.5 [IU]/h via INTRAVENOUS

## 2022-09-21 MED ORDER — SIMETHICONE 80 MG PO CHEW: 80.0000 mg | CHEWABLE_TABLET | ORAL | Status: DC | PRN

## 2022-09-21 SURGICAL SUPPLY — 33 items
APL PRP STRL LF DISP 70% ISPRP (MISCELLANEOUS) ×2
BENZOIN TINCTURE PRP APPL 2/3 (GAUZE/BANDAGES/DRESSINGS) IMPLANT
CHLORAPREP W/TINT 26 (MISCELLANEOUS) ×2 IMPLANT
CLAMP UMBILICAL CORD (MISCELLANEOUS) ×1 IMPLANT
CLOTH BEACON ORANGE TIMEOUT ST (SAFETY) ×1 IMPLANT
DRSG OPSITE POSTOP 4X10 (GAUZE/BANDAGES/DRESSINGS) ×1 IMPLANT
ELECT REM PT RETURN 9FT ADLT (ELECTROSURGICAL) ×1
ELECTRODE REM PT RTRN 9FT ADLT (ELECTROSURGICAL) ×1 IMPLANT
EXTRACTOR VACUUM M CUP 4 TUBE (SUCTIONS) IMPLANT
GAUZE SPONGE 4X4 12PLY STRL LF (GAUZE/BANDAGES/DRESSINGS) IMPLANT
GLOVE BIOGEL M 6.5 STRL (GLOVE) ×1 IMPLANT
GLOVE BIOGEL PI IND STRL 6.5 (GLOVE) ×1 IMPLANT
GLOVE BIOGEL PI IND STRL 7.0 (GLOVE) ×2 IMPLANT
GOWN STRL REUS W/TWL LRG LVL3 (GOWN DISPOSABLE) ×2 IMPLANT
HEMOSTAT ARISTA ABSORB 3G PWDR (HEMOSTASIS) IMPLANT
KIT ABG SYR 3ML LUER SLIP (SYRINGE) IMPLANT
NDL HYPO 25X5/8 SAFETYGLIDE (NEEDLE) IMPLANT
NEEDLE HYPO 25X5/8 SAFETYGLIDE (NEEDLE) IMPLANT
NS IRRIG 1000ML POUR BTL (IV SOLUTION) ×1 IMPLANT
PACK C SECTION WH (CUSTOM PROCEDURE TRAY) ×1 IMPLANT
PAD ABD 7.5X8 STRL (GAUZE/BANDAGES/DRESSINGS) IMPLANT
PAD OB MATERNITY 4.3X12.25 (PERSONAL CARE ITEMS) ×1 IMPLANT
RTRCTR C-SECT PINK 25CM LRG (MISCELLANEOUS) ×1 IMPLANT
STRIP CLOSURE SKIN 1/2X4 (GAUZE/BANDAGES/DRESSINGS) IMPLANT
SUT MON AB 2-0 CT1 27 (SUTURE) ×1 IMPLANT
SUT PDS AB 0 CTX 60 (SUTURE) IMPLANT
SUT PLAIN 2 0 XLH (SUTURE) IMPLANT
SUT VIC AB 0 CTX 36 (SUTURE) ×5
SUT VIC AB 0 CTX36XBRD ANBCTRL (SUTURE) ×4 IMPLANT
SUT VIC AB 4-0 KS 27 (SUTURE) ×1 IMPLANT
TOWEL OR 17X24 6PK STRL BLUE (TOWEL DISPOSABLE) ×1 IMPLANT
TRAY FOLEY W/BAG SLVR 14FR LF (SET/KITS/TRAYS/PACK) ×1 IMPLANT
WATER STERILE IRR 1000ML POUR (IV SOLUTION) ×1 IMPLANT

## 2022-09-21 NOTE — Op Note (Signed)
NAMEKEYUNA, CUTHRELL MEDICAL RECORD NO: [id number] ACCOUNT NO: [id number] DATE OF BIRTH: [birth year] FACILITY: [facility] LOCATION: [facility] PHYSICIAN: Virgina Evener. Claiborne Billings, DO  Operative Report   DATE OF PROCEDURE: 09/21/2022  PREOPERATIVE DIAGNOSIS:  Prior cesarean section x2 with newly diagnosed gestational hypertension.  POSTOPERATIVE DIAGNOSIS:  Prior cesarean section x2 with newly diagnosed gestational hypertension.  PROCEDURE:  Low transverse cesarean section.  SURGEON:  Virgina Evener. Claiborne Billings, DO  ASSISTANT:  Needed for complexity of case, proper visualization, tissue manipulation and retraction. Simone Autry-Lott, DO  ANESTHESIA:  Spinal.  ESTIMATED BLOOD LOSS:  322 mL  FINDINGS:  Female infant in cephalic presentation with Apgars 8 and 9, weight pending.  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  COMPLICATIONS:  None.  CONDITION:  Stable to PACU.  DESCRIPTION OF PROCEDURE:  The patient was taken to the operating room where spinal anesthesia was administered and found to be adequate.  She was prepped and draped in the normal sterile fashion in dorsal supine position.  A scalpel was used to make a  Pfannenstiel skin incision, which was then carried down to underlying layer of fascia with Bovie cautery.  The fascia was incised at the midline with scalpel and extended laterally with Mayo scissors.  Kocher clamps were then placed at the superior  aspect of the fascial incision.  Rectus muscles were dissected off bluntly and sharply.  Kocher clamps were placed then at the inferior aspect of the fascial incision and again rectus muscles were dissected off bluntly and sharply.  Hemostat was used to  separate rectus muscles superiorly and with good visualization of the peritoneum, the midline outer layers were transected using Bovie cautery and Metzenbaum scissors to allow for further entry into the peritoneum.  Peritoneum was entered sharply with  Metzenbaum  scissors and the peritoneal incision was extended bilateral traction.  Manual survey of the abdomen and pelvis revealed multiple areas of thin filmy adhesions with additional thicker inferior to anterior abdominal wall adhesions.  These were  carefully taken down with the use of Metzenbaum scissors and the vesicouterine peritoneum was then identified and also entered sharply with Metzenbaum scissors and the bladder flap developed digitally.  An Pharmacologist was then able to be placed  without difficulty.  Low transverse cesarean incision was made with the scalpel and the amniotic sac was entered bluntly.  Bandage scissors were used to further extend the hysterotomy at the right angle after cephalic and caudal traction was  insufficient.  The infant's head was elevated and delivered without difficulty followed by the remainder of the infant's body.  One body cord was noted.  The infant was dried and bulb suctioned and was crying vigorously.  After 60 seconds of delayed cord  clamping, the cord was doubly clamped and cut and the infant was handed off to awaiting neonatology.  Cord blood was collected and external massage of the uterus was performed with gentle traction on the umbilical cord to facilitate delivery of the  placenta.  The uterus was then cleared of all clot and debris and the uterine incision was closed with 0 Vicryl in a running fashion followed by a second layer of vertical imbrication. There was an area superior to the hysterotomy that was bleeding  minimally.  This was an area where adhesions were removed.  A running suture of Vicryl was used to reapproximate the serosa and control the bleeding.  An additional figure-of-eight was placed at the right angle of the uterus.  Excellent hemostasis was  noted.  Because of the raw areas of prior adhesive disease, Arista was sprinkled over the hysterotomy and adhesive disease areas.  The tubes and ovaries were examined and found to be normal.   The fascia was then reapproximated and closed with PDS in a  running locked fashion.  Prior to the fascial closure, 4-0 Monocryl was used to reapproximate the peritoneum.  After closure of the peritoneum, the fascia was then reapproximated and closed and the subcutaneous tissue was irrigated and dried. Minimal use  of Bovie cautery was needed to control small bleeders along the subcuticular area.  The skin was reapproximated and closed with Vicryl on a Keith needle.  The patient tolerated the procedure well.  Sponge, lap and needle counts were correct x2.  The  patient was taken to recovery in stable condition.   VAI D: 09/21/2022 10:05:41 pm T: 09/21/2022 10:18:00 pm  JOB: [id number]/ [id number]

## 2022-09-21 NOTE — Anesthesia Procedure Notes (Signed)
Spinal  Patient location during procedure: OR Start time: 09/21/2022 8:38 PM End time: 09/21/2022 8:40 PM Staffing Performed: anesthesiologist  Anesthesiologist: Atilano Median, DO Performed by: Atilano Median, DO Authorized by: Atilano Median, DO   Preanesthetic Checklist Completed: patient identified, IV checked, site marked, risks and benefits discussed, surgical consent, monitors and equipment checked, pre-op evaluation and timeout performed Spinal Block Patient position: sitting Prep: DuraPrep Patient monitoring: heart rate, cardiac monitor, continuous pulse ox and blood pressure Approach: midline Location: L4-5 Injection technique: single-shot Needle Needle type: Pencan  Needle gauge: 24 G Needle length: 10 cm Assessment Events: CSF return Additional Notes Patient identified. Risks/Benefits/Options discussed with patient including but not limited to bleeding, infection, nerve damage, paralysis, failed block, incomplete pain control, headache, blood pressure changes, nausea, vomiting, reactions to medications, itching and postpartum back pain. Confirmed with bedside nurse the patient's most recent platelet count. Confirmed with patient that they are not currently taking any anticoagulation, have any bleeding history or any family history of bleeding disorders. Patient expressed understanding and wished to proceed. All questions were answered. Sterile technique was used throughout the entire procedure. Please see nursing notes for vital signs. Warning signs of high block given to the patient including shortness of breath, tingling/numbness in hands, complete motor block, or any concerning symptoms with instructions to call for help. Patient was given instructions on fall risk and not to get out of bed. All questions and concerns addressed with instructions to call with any issues or inadequate analgesia.

## 2022-09-21 NOTE — MAU Note (Signed)
Consent signed by Anesthesia, Richardo Priest, and Chade Pitner, RN.

## 2022-09-21 NOTE — Anesthesia Preprocedure Evaluation (Addendum)
Anesthesia Evaluation  Patient identified by MRN, date of birth, ID band Patient awake    Reviewed: Allergy & Precautions, NPO status , Patient's Chart, lab work & pertinent test results  Airway Mallampati: II  TM Distance: >3 FB Neck ROM: Full    Dental no notable dental hx.    Pulmonary neg pulmonary ROS   Pulmonary exam normal        Cardiovascular hypertension,  Rhythm:Regular Rate:Normal     Neuro/Psych negative neurological ROS  negative psych ROS   GI/Hepatic negative GI ROS, Neg liver ROS,,,  Endo/Other  negative endocrine ROS    Renal/GU negative Renal ROS  negative genitourinary   Musculoskeletal negative musculoskeletal ROS (+)    Abdominal Normal abdominal exam  (+)   Peds  Hematology negative hematology ROS (+)   Anesthesia Other Findings   Reproductive/Obstetrics (+) Pregnancy                             Anesthesia Physical Anesthesia Plan  ASA: 2  Anesthesia Plan: Spinal   Post-op Pain Management:    Induction:   PONV Risk Score and Plan: 2 and Ondansetron, Dexamethasone and Treatment may vary due to age or medical condition  Airway Management Planned: Simple Face Mask, Natural Airway and Nasal Cannula  Additional Equipment: None  Intra-op Plan:   Post-operative Plan:   Informed Consent: I have reviewed the patients History and Physical, chart, labs and discussed the procedure including the risks, benefits and alternatives for the proposed anesthesia with the patient or authorized representative who has indicated his/her understanding and acceptance.     Dental advisory given  Plan Discussed with: CRNA  Anesthesia Plan Comments: (Lab Results      Component                Value               Date                      WBC                      6.0                 09/21/2022                HGB                      13.4                09/21/2022                 HCT                      40.4                09/21/2022                MCV                      80.5                09/21/2022                PLT                      188  09/21/2022             Lab Results      Component                Value               Date                      NA                       135                 12/05/2015                K                        3.9                 12/05/2015                CO2                      22                  12/05/2015                GLUCOSE                  90                  12/05/2015                BUN                      8                   12/05/2015                CREATININE               0.57                12/05/2015                CALCIUM                  8.6 (L)             12/05/2015                GFRNONAA                 >60                 12/05/2015           )       Anesthesia Quick Evaluation

## 2022-09-21 NOTE — Brief Op Note (Signed)
09/21/2022  9:53 PM  PATIENT:  [name]  36 y.o. female  PRE-OPERATIVE DIAGNOSIS:  gestational hypertension and previous cesarean  POST-OPERATIVE DIAGNOSIS:  gestational hypertension and previous cesarean x2  PROCEDURE:  Procedure(s): REPEAT CESAREAN SECTION (N/A)x3  SURGEON:  Surgeon(s) and Role:    Philip Aspen, DO - Primary ASSISTANT: needed for complexity of case, proper visualization, tissue manipulation, retraction    * Autry-Lott, Simone, DO - Assisting  ANESTHESIA:   spinal  EBL:  322cc  BLOOD ADMINISTERED:none  DRAINS: none   LOCAL MEDICATIONS USED:  NONE  FINDINGS: female infant, cephalic presentation, APGARS 8/9, weight pending, moderate adhesive disease from anterior abdominal wall to anterior uterus  SPECIMEN:  cord blood  DISPOSITION OF SPECIMEN:  N/A  COUNTS:  YES  PLAN OF CARE: Admit to inpatient   PATIENT DISPOSITION:  PACU - hemodynamically stable.   Delay start of Pharmacological VTE agent (>24hrs) due to surgical blood loss or risk of bleeding: not applicable

## 2022-09-21 NOTE — MAU Note (Signed)
[name] is a 36 y.o. at [redacted]w[redacted]d here in MAU reporting: rtn appt today, BP was high, checked it twice. Sent in for further eval.  Denies HA, visual changes, epigastric pain, nor increase in swelling. Denies bleeding, leaking, reports+FM.  Had non-stress today, was fine.   Onset of complaint: today- first elevation Pain score: none Vitals:   09/21/22 1704  BP: (!) 141/91  Pulse: 100  Resp: 17  Temp: 98.3 F (36.8 C)  SpO2: 100%     FHT:160 Lab orders placed from triage:  urine collected

## 2022-09-21 NOTE — H&P (Signed)
36 y.o. [redacted]w[redacted]d  G3P2002 comes in from office where she was discovered to have severe and mild range BPs.  Otherwise has good fetal movement and no bleeding.  Past Medical History:  Diagnosis Date   No pertinent past medical history     Past Surgical History:  Procedure Laterality Date   CESAREAN SECTION  10/19/2012   Procedure: CESAREAN SECTION;  Surgeon: Levi Aland, MD;  Location: [facility];  Service: Obstetrics;  Laterality: N/A;   CESAREAN SECTION N/A 12/05/2015   Procedure: CESAREAN SECTION;  Surgeon: Philip Aspen, DO;  Location: [facility];  Service: Obstetrics;  Laterality: N/A;   NO PAST SURGERIES      OB History  Gravida Para Term Preterm AB Living  3 2 2     2   SAB IAB Ectopic Multiple Live Births        0 2    # Outcome Date GA Lbr Len/2nd Weight Sex Delivery Anes PTL Lv  3 Current           2 Term 12/05/15 103w1d 10:34 / 03:38 3530 g M CS-LVertical EPI  LIV  1 Term 10/19/12 [redacted]w[redacted]d 19:21 / 00:23  M CS-LVertical EPI  LIV    Social History   Socioeconomic History   Marital status: Significant Other    Spouse name: Not on file   Number of children: Not on file   Years of education: Not on file   Highest education level: Not on file  Occupational History   Not on file  Tobacco Use   Smoking status: Never   Smokeless tobacco: Never  Substance and Sexual Activity   Alcohol use: No   Drug use: No   Sexual activity: Yes    Birth control/protection: None  Other Topics Concern   Not on file  Social History Narrative   Not on file   Social Determinants of Health   Financial Resource Strain: Not on file  Food Insecurity: Not on file  Transportation Needs: Not on file  Physical Activity: Not on file  Stress: Not on file  Social Connections: Not on file  Intimate Partner Violence: Not on file   Patient has no known allergies.    Prenatal Transfer Tool  Maternal Diabetes: No Genetic Screening: Normal Maternal Ultrasounds/Referrals: Normal Fetal Ultrasounds or  other Referrals:  None Maternal Substance Abuse:  No Significant Maternal Medications:  None Significant Maternal Lab Results: Group B Strep negative  Other PNC: uncomplicated.    Vitals:   09/21/22 1704 09/21/22 1736 09/21/22 1745 09/21/22 1800  BP: (!) 141/91 (!) 137/91 (!) 135/93 (!) 152/96  Pulse: 100 94 97 99  Resp: 17     Temp: 98.3 F (36.8 C)     TempSrc: Oral     SpO2: 100%  99% 99%  Weight: 88.2 kg     Height: 5\' 4"  (1.626 m)       Lungs/Cor:  NAD Abdomen:  soft, gravid Ex:  no cords, erythema SVE:  deferred FHTs:  140, good STV, NST R; Cat 1 tracing. Toco:  q 1-6   A/P   Admit with GTN at 37.0 for repeat C/S  GBS Neg  2gAncef pre-op prophylaxis  Other routine pre-op PEC labs pending.  09/23/22

## 2022-09-21 NOTE — Transfer of Care (Signed)
Immediate Anesthesia Transfer of Care Note  Patient: [name]  Procedure(s) Performed: REPEAT CESAREAN SECTION  Patient Location: PACU  Anesthesia Type:Spinal  Level of Consciousness: awake, alert , and patient cooperative  Airway & Oxygen Therapy: Patient Spontanous Breathing  Post-op Assessment: Report given to RN and Post -op Vital signs reviewed and stable  Post vital signs: Reviewed and stable  Last Vitals:  Vitals Value Taken Time  BP    Temp    Pulse 100 09/21/22 2207  Resp 24 09/21/22 2207  SpO2 97 % 09/21/22 2207  Vitals shown include unvalidated device data.  Last Pain:  Vitals:   09/21/22 1704  TempSrc: Oral         Complications: No notable events documented.

## 2022-09-21 NOTE — MAU Provider Note (Cosign Needed)
History     CSN: [id number]  Arrival date and time: 09/21/22 1630   Event Date/Time   First Provider Initiated Contact with Patient 09/21/22 1744      Chief Complaint  Patient presents with   Hypertension   Patient sent over from the office for elevated blood pressures in the office.  Reports that she has not had any issues this pregnancy with her blood pressure until today.  Initial blood pressure reading of 150/90.  Repeat 15 minutes later was 132/92.  Denies any headaches, changes in vision, right upper quadrant pain, swelling in lower extremities.  Denies any bleeding, contractions, gushing of fluid.  Reports good fetal movement.  G3P2 @ [redacted]w[redacted]d  OB History     Gravida  3   Para  2   Term  2   Preterm      AB      Living  2      SAB      IAB      Ectopic      Multiple  0   Live Births  2           Past Medical History:  Diagnosis Date   No pertinent past medical history     Past Surgical History:  Procedure Laterality Date   CESAREAN SECTION  10/19/2012   Procedure: CESAREAN SECTION;  Surgeon: Levi Aland, MD;  Location: [facility];  Service: Obstetrics;  Laterality: N/A;   CESAREAN SECTION N/A 12/05/2015   Procedure: CESAREAN SECTION;  Surgeon: Philip Aspen, DO;  Location: [facility];  Service: Obstetrics;  Laterality: N/A;   NO PAST SURGERIES      Family History  Problem Relation Age of Onset   Hypertension Mother    Healthy Father    Other Neg Hx     Social History   Tobacco Use   Smoking status: Never   Smokeless tobacco: Never  Substance Use Topics   Alcohol use: No   Drug use: No    Allergies: No Known Allergies  Medications Prior to Admission  Medication Sig Dispense Refill Last Dose   Prenatal Vit-Fe Fumarate-FA (PRENATAL MULTIVITAMIN) TABS tablet Take 1 tablet by mouth daily at 12 noon.      ibuprofen (ADVIL) 800 MG tablet Take 1 tablet (800 mg total) by mouth 3 (three) times daily. (Patient not taking: Reported on  09/21/2022) 21 tablet 0 Not Taking   Salicylic Acid 28 % SOLN Apply 1 application topically daily. (Patient not taking: Reported on 09/21/2022) 10 g 0 Not Taking   tetrahydrozoline 0.05 % ophthalmic solution Place 1 drop into both eyes daily as needed. (Patient not taking: Reported on 09/21/2022)   Not Taking   tiZANidine (ZANAFLEX) 4 MG tablet Take 1-2 tablets (4-8 mg total) by mouth every 6 (six) hours as needed for muscle spasms. (Patient not taking: Reported on 09/21/2022) 21 tablet 0 Not Taking    Review of Systems  Constitutional:  Negative for appetite change and fever.  HENT:  Negative for congestion and rhinorrhea.   Eyes:  Negative for visual disturbance.  Respiratory:  Negative for shortness of breath.   Cardiovascular:  Negative for chest pain.  Endocrine: Negative for polyuria.  Genitourinary:  Negative for vaginal bleeding and vaginal discharge.  Musculoskeletal:  Negative for arthralgias.  Neurological:  Negative for headaches.  All other systems reviewed and are negative.  Physical Exam   Blood pressure (!) 152/96, pulse 99, temperature 98.3 F (36.8 C), temperature source Oral, resp.  rate 17, height 5\' 4"  (1.626 m), weight 88.2 kg, SpO2 99 %.  Physical Exam Vitals reviewed.  Constitutional:      Appearance: Normal appearance.  HENT:     Head: Normocephalic and atraumatic.     Nose: Nose normal.     Mouth/Throat:     Mouth: Mucous membranes are moist.  Eyes:     Extraocular Movements: Extraocular movements intact.     Pupils: Pupils are equal, round, and reactive to light.  Cardiovascular:     Rate and Rhythm: Normal rate and regular rhythm.  Pulmonary:     Effort: Pulmonary effort is normal.  Abdominal:     General: Abdomen is flat.  Musculoskeletal:     Cervical back: Normal range of motion.  Skin:    Capillary Refill: Capillary refill takes less than 2 seconds.  Neurological:     General: No focal deficit present.     Mental Status: She is alert.   Psychiatric:        Mood and Affect: Mood normal.     MAU Course  Procedures  MDM CBC CMP Protein creatinine ratio ABO  Assessment and Plan  Patient presenting for evaluation after having elevated blood pressure at her primary OB clinic.  Initial elevated blood pressure at 1336 today.  Blood pressure 150/90.  Patient ruled in for gestational hypertension while in the MAU.  Discussed with on-call OB provider for her primary OB.  She is CS x 2 so plan is for repeat C-section tonight.  Patient will be admitted.  09/21/2022, 6:08 PM

## 2022-09-22 ENCOUNTER — Encounter (HOSPITAL_COMMUNITY): Payer: Self-pay | Admitting: Obstetrics and Gynecology

## 2022-09-22 LAB — CBC
HCT: 37 % (ref 36.0–46.0)
Hemoglobin: 11.9 g/dL — ABNORMAL LOW (ref 12.0–15.0)
MCH: 26.5 pg (ref 26.0–34.0)
MCHC: 32.2 g/dL (ref 30.0–36.0)
MCV: 82.4 fL (ref 80.0–100.0)
Platelets: 188 10*3/uL (ref 150–400)
RBC: 4.49 MIL/uL (ref 3.87–5.11)
RDW: 14.7 % (ref 11.5–15.5)
WBC: 9 10*3/uL (ref 4.0–10.5)
nRBC: 0 % (ref 0.0–0.2)

## 2022-09-22 NOTE — Progress Notes (Signed)
Subjective: Postpartum Day #1: Cesarean Delivery Patient reports incisional pain and tolerating PO.    Objective: Vital signs in last 24 hours: Temp:  [97.7 F (36.5 C)-99 F (37.2 C)] 98.7 F (37.1 C) (12/27 0600) Pulse Rate:  [94-108] 94 (12/27 0230) Resp:  [17-27] 20 (12/27 0600) BP: (122-171)/(64-96) 122/78 (12/27 0230) SpO2:  [96 %-100 %] 98 % (12/27 0600) Weight:  [88.2 kg] 88.2 kg (12/26 1704)  Physical Exam:  General: alert Lochia: appropriate Uterine Fundus: firm Incision: pressure dressing intact  Recent Labs    09/21/22 1752 09/22/22 0526  HGB 13.4 11.9*  HCT 40.4 37.0    Assessment/Plan: Status post Cesarean section. Doing well postoperatively.  Continue current care, ambulate, monitor BP-good so far.  Zenaida Niece, MD 09/22/2022, 8:33 AM

## 2022-09-22 NOTE — Lactation Note (Signed)
This note was copied from a baby's chart. Lactation Consultation Note  Patient Name: [name] [id number] Date: 09/22/2022 Reason for consult: Initial assessment;Early term 37-38.6wks (C/S delivery) Age:35 hours, P3 Birth Parent is experienced with breastfeeding see maternal data below. Per Birth Parent,  infant BF for 20 minutes in Recovery at 2300 pm. Infant appeared content not interested in feeding at this time. LC changed a void diaper while in room. Birth Parent will continue to BF infant according to hunger cues, on demand, 8+ times within 24 hours, STS. LC set Birth Parent up with DEBP, Birth Parent was pumping when LC left the room and had expressed about 5 mls of colostrum. Birth Parent fitted with 27 mm Breast flange on her right breast and 24 mm breast flange on her left breast. Birth Parent plans to continue to latch infant every feeding and  afterwards supplement infant tonight with any  EBM that is expressed using a spoon or bottle nipple( Parent's choice).  Parent feeding will be reassessed  in morning if Birth Parent will need to offer infant any formula , Birth Parent declined using donor breast milk as an supplement option , her choice is formula if needed. Birth Parent knows to call RN/LC for latch assistance if needed. Mom made aware of O/P services, breastfeeding support groups, community resources, and our phone # for post-discharge questions.   Maternal Data Has patient been taught Hand Expression?: Yes Does the patient have breastfeeding experience prior to this delivery?: Yes How long did the patient breastfeed?: Birth Parent BF 1st child for one year and 2nd child for 2 1/2 years who is current 37 years old.  Feeding Mother's Current Feeding Choice: Breast Milk and Formula  LATCH Score                    Lactation Tools Discussed/Used Tools: Pump Breast pump type: Double-Electric Breast Pump Pump Education: Setup, frequency, and cleaning;Milk Storage Reason  for Pumping: Infant is ETI, less than 6 lbs, C/S delivery Pumping frequency: Continue to pump every 3 hours for 15 minutes on inital setting. Pumped volume: 5 mL (Birth Parent was still pumping when LC left the room.)  Interventions Interventions: Breast feeding basics reviewed;Position options;Skin to skin;Assisted with latch;Education;Hand express;LC Services brochure;DEBP  Discharge Pump:  (Per Birth Parent she ordered DEBP with her insurance.)  Consult Status Consult Status: Follow-up Date: 09/22/22 Follow-up type: In-patient    [name] 09/22/2022, 1:12 AM

## 2022-09-22 NOTE — Lactation Note (Signed)
This note was copied from a baby's chart. Lactation Consultation Note  Patient Name: [name] [id number] Date: 09/22/2022 Reason for consult: Follow-up assessment;Early term 37-38.6wks;Infant < 6lbs Age:35 hours  P3, 37 weeks.  Mother is breastfeeding and supplementing with her colostrum.  Discussed waking baby q 3 hours and feeding on demand.  Post pump and give baby back volume pumped.  Advised to call her insurance company.  She states her DEBP is on it's way.  Suggest calling for help with latching as needed and described LPI feeding behavior. Spoke with Michelle Nasuti RN regarding need to supplement infant.   Maternal Data Has patient been taught Hand Expression?: Yes  Feeding Mother's Current Feeding Choice: Breast Milk  LATCH Score Latch: Repeated attempts needed to sustain latch, nipple held in mouth throughout feeding, stimulation needed to elicit sucking reflex.  Audible Swallowing: A few with stimulation  Type of Nipple: Everted at rest and after stimulation  Comfort (Breast/Nipple): Soft / non-tender  Hold (Positioning): Assistance needed to correctly position infant at breast and maintain latch.  LATCH Score: 7   Lactation Tools Discussed/Used  DEBP  Interventions Interventions: Education;Hand pump  Discharge Pump: Advised to call insurance company  Consult Status Consult Status: Follow-up Date: 09/23/22 Follow-up type: In-patient    Dahlia Byes [facility] [address] 09/22/2022, 11:01 AM

## 2022-09-22 NOTE — Anesthesia Postprocedure Evaluation (Signed)
Anesthesia Post Note  Patient: [name]  Procedure(s) Performed: REPEAT CESAREAN SECTION     Patient location during evaluation: PACU Anesthesia Type: Spinal Level of consciousness: oriented and awake and alert Pain management: pain level controlled Vital Signs Assessment: post-procedure vital signs reviewed and stable Respiratory status: spontaneous breathing and respiratory function stable Cardiovascular status: blood pressure returned to baseline and stable Postop Assessment: no headache, no backache and no apparent nausea or vomiting Anesthetic complications: no   No notable events documented.  Last Vitals:  Vitals:   09/22/22 0030 09/22/22 0130  BP: (!) 141/87 123/87  Pulse: 98 97  Resp: 18 20  Temp: 37.2 C 36.9 C  SpO2: 99% 99%    Last Pain:  Vitals:   09/22/22 0130  TempSrc: Oral  PainSc: 0-No pain                 Earl Lites P Lannah Koike

## 2022-09-22 NOTE — Lactation Note (Signed)
This note was copied from a baby's chart. Lactation Consultation Note  Patient Name: [name] [id number] Date: 09/22/2022 Reason for consult: Follow-up assessment;Infant < 6lbs;Infant weight loss (-4% weight loss) Age:36 years old Birth Parent feeding preference is : Breastfeeding and using donor breast milk. P3, ETI female infant, -4% weight loss, infant had 3 stools and 1 void diaper since birth, Birth Parent was changing a stool diaper when as LC was leaving the room. Birth Parent has not been breastfeeding infant every 3 hours but very often. Per Birth Parent infant was BF at 1500 then again  for 25 minutes at 1515, then again at 1540 for 10 minutes see flow sheet, LC discussed with Birth Parent due Infant weighing less  than 6 lbs at birth and with gestational weeks at 37 weeks,  to limit feedings to 20 minutes at chest , every 3 hours and afterwards then  supplement infant with any EBM/ then donor, the total feedings should be 30 minutes or less to help reserve infant's energy . Birth Parent has not been supplementing infant as previously advised, LC gave Birth Parent  handout "Caring For Your Special Baby" and RN gave Birth Parent Crib Card with feeding amounts for LPTI. Infant was supplemented with 10 mls of donor breast milk using purple and clear slow flow bottle nipple and 2 mls of Birth Parent's EBM that was pumped with DEBP with current feeding.  Infant consumed 12 mls of EBM/Donor milk .LC encouraged Birth Parent to  continue to use DEBP and not hand pump to help stimulate and establish her milk supply. Birth Parent has not been pumping. Birth Parent understands that EBM is safe for 4 hours at room temperature whereas donor breast milk must be used within 1 hour.   Birth Parent feeding plan: 1- Follow LPTI feeding policy and procedure, BF infant 8+ times within 24 hours, STS and limit total feedings to 30 minutes or less, chest feeding 20 minutes and then supplement infant with EDB/ donor  breast milk. 2- Continue to use DEBP every 3 hours for 15 minutes on initial setting. 3- If Birth Parent has any questions, concerns or need latch assistance to call RN/ LC.  Maternal Data    Feeding Mother's Current Feeding Choice: Breast Milk and Donor Milk  LATCH Score                    Lactation Tools Discussed/Used Breast pump type: Double-Electric Breast Pump Pumping frequency: Continue to pump every 3 hours for 15 minutes on inital setting. Pumped volume: 2 mL (that was spoon fed to infant.)  Interventions Interventions: LPT handout/interventions;Education;Pace feeding;DEBP  Discharge    Consult Status Consult Status: Follow-up Date: 09/23/22 Follow-up type: In-patient    [name] 09/22/2022, 5:18 PM

## 2022-09-23 ENCOUNTER — Encounter (HOSPITAL_COMMUNITY): Payer: Self-pay | Admitting: Obstetrics and Gynecology

## 2022-09-23 LAB — RPR: RPR Ser Ql: NONREACTIVE

## 2022-09-23 NOTE — Progress Notes (Signed)
Subjective: Postpartum Day 2: Cesarean Delivery Patient reports incisional pain, tolerating PO, + flatus, + BM, and no problems voiding.  Lochia mild. Pain well controlled with medication. She is bonding well with baby - breast/bottlefeeding to combat weight loss. Peds recommends baby stay till tomorrow.  Pt denies HA, CP, SOB or CP.   Objective: Vital signs in last 24 hours: Temp:  [97.9 F (36.6 C)-98.5 F (36.9 C)] 97.9 F (36.6 C) (12/28 0549) Pulse Rate:  [75-80] 75 (12/28 0549) Resp:  [16-17] 17 (12/28 0549) BP: (113-121)/(70-82) 113/70 (12/28 0549) SpO2:  [97 %-99 %] 99 % (12/28 0549)  Physical Exam:  General: alert, cooperative, and no distress Lochia: appropriate Uterine Fundus: firm Incision: no significant drainage DVT Evaluation: No evidence of DVT seen on physical exam.  Recent Labs    09/21/22 1752 09/22/22 0526  HGB 13.4 11.9*  HCT 40.4 37.0    Assessment/Plan: Status post Cesarean section. Doing well postoperatively.  Continue current care Discharge home tomorrow .  Cathrine Muster, DO 09/23/2022, 11:06 AM

## 2022-09-23 NOTE — Lactation Note (Addendum)
This note was copied from a baby's chart. Lactation Consultation Note  Patient Name: [name] [name] Date: 09/23/2022 Reason for consult: Follow-up assessment;Early term 37-38.6wks;Infant weight loss (Infant with -9.19% weight loss) Age:35 hours Birth Parent changed black stool with slight greenish color P3, ETI female infant, less than 6 lbs at birth. Per Birth Parent she recently used the DEBP and pumped 5 mls of EBM. Birth Parent only used DEBP 1x today. LC reviewed importance of pumping to help establish Birth Parent's milk supply. Per Birth Parent, she is only feeding infant every 3 hours now and limiting chest feeding to 20 minutes or less. LC discussed LPTI feeding policy again today and why it is important to supplement infant with EBM/Donor milk or formula to help reserve infant's energy and stabilize infant's weight loss. Per Birth Parent, infant doesn't take much with bottle. Per Birth Parent, infant recently BF for 10 minutes at 1234 pm but she did not supplement infant after recent feeding. LC discussed this good time to supplement infant. LC placed infant in side- lying position and infant consumed 5 mls of EBM and 25 mls of 20 kcal formula that was pace feed and infant burped after every 5 mls of formula. Current feeding infant consumed 30 mls ( 5 mls EBM/25 mls formula) with this feeding.  Birth Parent is feeling more confident that she can supplement infant using the bottle.  LC suggested  Birth Parent use all donor milk in fridge before offering infant formula, infant given 100 mls of donor breast milk earlier today, LC was unaware until after infant was given formula.   Birth Parent current feeding plan today.  1- Follow LPTI feeding policy- BF infant every 3 hours for 15 minutes and afterwards supplement infant with any EBM pumped and then offer donor breast milk 2nd based on Day 2 offer infant (18-21 mls)  or  more if infant wants it per feeding.  After 48 hours Birth Parent  will increase intake to 24-28 mls per feeding. Going forward Birth Parent will supplement infant every feeding after latching infant at the breast to help stabilize weight loss. 2- Birth Parent knows to ask RN for feeding assistance with supplement infant with bottle  if needed. 3- Birth Parent will continue to use DEBP every 3 hours  for 15 minutes today as recommend. 4-Birth Parent knows to call RN/LC if their are any questions or  concerns about infant's feeding plan.   Maternal Data    Feeding Mother's Current Feeding Choice: Breast Milk and Formula  LATCH Score                    Lactation Tools Discussed/Used    Interventions Interventions: Skin to skin;Pace feeding;Education;DEBP;LPT handout/interventions  Discharge    Consult Status Consult Status: Follow-up Date: 09/24/22 Follow-up type: In-patient    [name] 09/23/2022, 1:59 PM

## 2022-09-24 MED ORDER — ACETAMINOPHEN 325 MG PO TABS: 650.0000 mg | ORAL_TABLET | Freq: Four times a day (QID) | ORAL | Status: AC | PRN

## 2022-09-24 MED ORDER — OXYCODONE HCL 5 MG PO TABS: 5.0000 mg | ORAL_TABLET | ORAL | 0 refills | Status: AC | PRN

## 2022-09-24 MED ORDER — IBUPROFEN 600 MG PO TABS: 600.0000 mg | ORAL_TABLET | Freq: Four times a day (QID) | ORAL | 1 refills | Status: AC | PRN

## 2022-09-24 NOTE — Discharge Summary (Signed)
Postpartum Discharge Summary  Date of Service updated 09/24/22      Patient Name: [name] DOB: 01-19-1986 MRN: [id number]  Date of admission: 09/21/2022 Delivery date:09/21/2022  Delivering provider: Allyn Kenner  Date of discharge: 09/24/2022  Admitting diagnosis: Pregnant and not yet delivered [Z34.90] Intrauterine pregnancy: [redacted]w[redacted]d    Secondary diagnosis:  Principal Problem:   Pregnant and not yet delivered  Additional problems: gestational hypertension    Discharge diagnosis: Term Pregnancy Delivered and Gestational Hypertension                                              Post partum procedures: none Augmentation: N/A Complications: None  Hospital course: Sceduled C/S   36y.o. yo G3P3003 at [id number] admitted to the hospital 09/21/2022 for scheduled cesarean section with the following indication: repeat with new diagnosis of gestational hypertension .Delivery details are as follows:  Membrane Rupture Time/Date: 9:09 PM ,09/21/2022   Delivery Method:C-Section, Low Transverse  Details of operation can be found in separate operative note.  Patient had a postpartum course uncomplicated.  She is ambulating, tolerating a regular diet, passing flatus, and urinating well. Patient is discharged home in stable condition on  09/24/22        Newborn Data: Birth date:09/21/2022  Birth time:9:09 PM  Gender:Female  Living status:Living  Apgars:8 ,9  Weight:2665 g     Magnesium Sulfate received: No BMZ received: No Rhophylac:N/A MMR:N/A T-DaP:Given prenatally Flu: No Transfusion:No  Physical exam  Vitals:   09/23/22 1500 09/23/22 2039 09/23/22 2127 09/24/22 0501  BP: 132/83 (!) 140/88 129/80 130/79  Pulse: 89 86 88 88  Resp: _0 Temp: 97.8 F (36.6 C) 97.9 F (36.6 C)  97.7 F (36.5 C)  TempSrc: Oral Oral  Oral  SpO2:  98%  96%  Weight:      Height:       General: alert, cooperative, and no distress Lochia: appropriate Uterine Fundus:  firm Incision: Healing well with no significant drainage, No significant erythema, Dressing is clean, dry, and intact DVT Evaluation: No evidence of DVT seen on physical exam. Labs: Lab Results  Component Value Date   WBC 9.0 09/22/2022   HGB 11.9 (L) 09/22/2022   HCT 37.0 09/22/2022   MCV 82.4 09/22/2022   PLT 188 09/22/2022      Latest Ref Rng & Units 09/21/2022    5:52 PM  CMP  Glucose 70 - 99 mg/dL 79   BUN 6 - 20 mg/dL 9   Creatinine 0.44 - 1.00 mg/dL 0.63   Sodium 135 - 145 mmol/L 138   Potassium 3.5 - 5.1 mmol/L 3.5   Chloride 98 - 111 mmol/L 109   CO2 22 - 32 mmol/L 21   Calcium 8.9 - 10.3 mg/dL 8.6   Total Protein 6.5 - 8.1 g/dL 5.9   Total Bilirubin 0.3 - 1.2 mg/dL 0.4   Alkaline Phos 38 - 126 U/L 57   AST 15 - 41 U/L 24   ALT 0 - 44 U/L 27    Edinburgh Score:    09/23/2022   12:27 AM  Edinburgh Postnatal Depression Scale Screening Tool  I have been able to laugh and see the funny side of things. 0  I have looked forward with enjoyment to things. 0  I have blamed myself unnecessarily when things went  wrong. 0  I have been anxious or worried for no good reason. 0  I have felt scared or panicky for no good reason. 0  Things have been getting on top of me. 1  I have been so unhappy that I have had difficulty sleeping. 0  I have felt sad or miserable. 0  I have been so unhappy that I have been crying. 0  The thought of harming myself has occurred to me. 0  Edinburgh Postnatal Depression Scale Total 1      After visit meds:  Allergies as of 09/24/2022   No Known Allergies      Medication List     STOP taking these medications    Salicylic Acid 28 % Soln   tetrahydrozoline 0.05 % ophthalmic solution   tiZANidine 4 MG tablet Commonly known as: Zanaflex       TAKE these medications    acetaminophen 325 MG tablet Commonly known as: TYLENOL Take 2 tablets (650 mg total) by mouth every 6 (six) hours as needed.   ibuprofen 600 MG  tablet Commonly known as: ADVIL Take 1 tablet (600 mg total) by mouth every 6 (six) hours as needed. What changed:  medication strength how much to take when to take this reasons to take this   oxyCODONE 5 MG immediate release tablet Commonly known as: Oxy IR/ROXICODONE Take 1 tablet (5 mg total) by mouth every 4 (four) hours as needed for moderate pain or severe pain.   prenatal multivitamin Tabs tablet Take 1 tablet by mouth daily at 12 noon.         Discharge home in stable condition Infant Feeding: Breast Infant Disposition:home with mother Discharge instruction: per After Visit Summary and Postpartum booklet. Activity: Advance as tolerated. Pelvic rest for 6 weeks.  Diet: low salt diet Anticipated Birth Control: Unsure Postpartum Appointment:4 weeks Additional Postpartum F/U: Incision check 1 week and BP check 1 week Future Appointments:No future appointments. Follow up Visit:  Follow-up Information     Allyn Kenner, DO. Schedule an appointment as soon as possible for a visit in 1 week(s).   Specialty: Obstetrics and Gynecology Why: Blood pressure and incision check Contact information: [address] [place] [address] [phone]                     09/24/2022 Rowland Lathe, MD

## 2022-09-24 NOTE — Progress Notes (Signed)
Subjective: Postpartum Day 3: Cesarean Delivery Feeling well. Ambulating, voiding, tolerating PO. Breastfeeding. Minimal lochia.   Objective: Patient Vitals for the past 24 hrs:  BP Temp Temp src Pulse Resp SpO2  09/24/22 0501 130/79 97.7 F (36.5 C) Oral 88 16 96 %  09/23/22 2127 129/80 -- -- 88 -- --  09/23/22 2039 (!) 140/88 97.9 F (36.6 C) Oral 86 17 98 %  09/23/22 1500 132/83 97.8 F (36.6 C) Oral 89 17 --    Physical Exam:  General: alert, cooperative, and no distress Lochia: appropriate Uterine Fundus: firm Incision: healing well, no significant drainage, no dehiscence, no significant erythema DVT Evaluation: No evidence of DVT seen on physical exam.  Recent Labs    09/21/22 1752 09/22/22 0526  HGB 13.4 11.9*  HCT 40.4 37.0    Assessment/Plan:  Cuca Severino G3P3003 POD#3 sp repeat cesarean at [redacted]w[redacted]d 1. PPC: routine PP care 2. GHTN: mostly normotensive postpartum, no meds.  3. Rh pos 4. Dispo: discharge home today. Instructions reviewed.   Charlett Nose 09/24/2022, 8:29 AM

## 2022-09-29 ENCOUNTER — Telehealth (HOSPITAL_COMMUNITY): Payer: Self-pay | Admitting: *Deleted

## 2022-09-29 NOTE — Telephone Encounter (Signed)
Hospital Discharge Follow-Up Call:  Patient reports that she is well and adjusting to life with a newborn.  She has no concerns about her healing process.  She has an OB appointment on 10/01/22 for an incision and BP check.  EPDS today was 4 and she endorses this accurately reflects that she is doing well emotionally.  Patient says that baby is well.  They went to pediatrician yesterday and will go again on 10-01-22 for jaundice and weight check.  She reports that baby sleeps in a bassinet.  Reviewed ABCs of Safe Sleep.

## 2022-10-07 ENCOUNTER — Inpatient Hospital Stay (HOSPITAL_COMMUNITY): Admit: 2022-10-07 | Payer: Medicaid Other | Admitting: Obstetrics and Gynecology

## 2023-01-11 DIAGNOSIS — J029 Acute pharyngitis, unspecified: Secondary | ICD-10-CM | POA: Diagnosis not present

## 2023-01-11 DIAGNOSIS — R0981 Nasal congestion: Secondary | ICD-10-CM | POA: Diagnosis not present

## 2023-09-02 DIAGNOSIS — E559 Vitamin D deficiency, unspecified: Secondary | ICD-10-CM | POA: Diagnosis not present

## 2023-09-02 DIAGNOSIS — H00012 Hordeolum externum right lower eyelid: Secondary | ICD-10-CM | POA: Diagnosis not present

## 2023-09-02 DIAGNOSIS — Z0000 Encounter for general adult medical examination without abnormal findings: Secondary | ICD-10-CM | POA: Diagnosis not present

## 2023-09-02 DIAGNOSIS — R5383 Other fatigue: Secondary | ICD-10-CM | POA: Diagnosis not present

## 2023-09-02 DIAGNOSIS — Z3200 Encounter for pregnancy test, result unknown: Secondary | ICD-10-CM | POA: Diagnosis not present

## 2023-09-02 DIAGNOSIS — Z6829 Body mass index (BMI) 29.0-29.9, adult: Secondary | ICD-10-CM | POA: Diagnosis not present

## 2023-09-23 DIAGNOSIS — E559 Vitamin D deficiency, unspecified: Secondary | ICD-10-CM | POA: Diagnosis not present

## 2023-09-23 DIAGNOSIS — Z6830 Body mass index (BMI) 30.0-30.9, adult: Secondary | ICD-10-CM | POA: Diagnosis not present

## 2023-09-23 DIAGNOSIS — Z3200 Encounter for pregnancy test, result unknown: Secondary | ICD-10-CM | POA: Diagnosis not present

## 2023-09-23 DIAGNOSIS — E6609 Other obesity due to excess calories: Secondary | ICD-10-CM | POA: Diagnosis not present

## 2023-09-23 DIAGNOSIS — H02823 Cysts of right eye, unspecified eyelid: Secondary | ICD-10-CM | POA: Diagnosis not present

## 2023-12-02 DIAGNOSIS — H02823 Cysts of right eye, unspecified eyelid: Secondary | ICD-10-CM | POA: Diagnosis not present

## 2024-04-26 DIAGNOSIS — Z0001 Encounter for general adult medical examination with abnormal findings: Secondary | ICD-10-CM | POA: Diagnosis not present

## 2024-04-26 DIAGNOSIS — L989 Disorder of the skin and subcutaneous tissue, unspecified: Secondary | ICD-10-CM | POA: Diagnosis not present

## 2024-04-26 DIAGNOSIS — R350 Frequency of micturition: Secondary | ICD-10-CM | POA: Diagnosis not present
